# Patient Record
Sex: Female | Born: 1935 | Race: White | Hispanic: No | Smoking: Never smoker
Health system: Southern US, Community
[De-identification: ages and names within clinical notes are randomized; demographics above are authoritative.]

## PROBLEM LIST (undated history)

## (undated) DIAGNOSIS — N059 Unspecified nephritic syndrome with unspecified morphologic changes: Secondary | ICD-10-CM

## (undated) DIAGNOSIS — M545 Low back pain, unspecified: Secondary | ICD-10-CM

## (undated) DIAGNOSIS — G629 Polyneuropathy, unspecified: Secondary | ICD-10-CM

## (undated) DIAGNOSIS — G20 Parkinson's disease: Secondary | ICD-10-CM

## (undated) DIAGNOSIS — R269 Unspecified abnormalities of gait and mobility: Secondary | ICD-10-CM

## (undated) DIAGNOSIS — R413 Other amnesia: Secondary | ICD-10-CM

## (undated) DIAGNOSIS — C541 Malignant neoplasm of endometrium: Secondary | ICD-10-CM

## (undated) DIAGNOSIS — E669 Obesity, unspecified: Secondary | ICD-10-CM

## (undated) DIAGNOSIS — G20A1 Parkinson's disease without dyskinesia, without mention of fluctuations: Secondary | ICD-10-CM

## (undated) DIAGNOSIS — K449 Diaphragmatic hernia without obstruction or gangrene: Secondary | ICD-10-CM

## (undated) DIAGNOSIS — C499 Malignant neoplasm of connective and soft tissue, unspecified: Secondary | ICD-10-CM

## (undated) HISTORY — PX: OTHER SURGICAL HISTORY: SHX169

## (undated) HISTORY — DX: Polyneuropathy, unspecified: G62.9

## (undated) HISTORY — DX: Unspecified abnormalities of gait and mobility: R26.9

## (undated) HISTORY — PX: FOOT SURGERY: SHX648

## (undated) HISTORY — PX: ABDOMINAL HYSTERECTOMY: SHX81

## (undated) HISTORY — DX: Unspecified nephritic syndrome with unspecified morphologic changes: N05.9

## (undated) HISTORY — DX: Low back pain, unspecified: M54.50

## (undated) HISTORY — DX: Diaphragmatic hernia without obstruction or gangrene: K44.9

## (undated) HISTORY — DX: Obesity, unspecified: E66.9

## (undated) HISTORY — DX: Parkinson's disease: G20

## (undated) HISTORY — DX: Low back pain: M54.5

## (undated) HISTORY — DX: Other amnesia: R41.3

## (undated) HISTORY — DX: Malignant neoplasm of endometrium: C54.1

---

## 1968-08-01 DIAGNOSIS — C499 Malignant neoplasm of connective and soft tissue, unspecified: Secondary | ICD-10-CM

## 1968-08-01 HISTORY — DX: Malignant neoplasm of connective and soft tissue, unspecified: C49.9

## 1997-12-15 ENCOUNTER — Encounter: Admission: RE | Admit: 1997-12-15 | Discharge: 1998-03-15 | Payer: Self-pay | Admitting: Family Medicine

## 1998-01-19 ENCOUNTER — Other Ambulatory Visit: Admission: RE | Admit: 1998-01-19 | Discharge: 1998-01-19 | Payer: Self-pay | Admitting: Obstetrics and Gynecology

## 1998-04-02 ENCOUNTER — Ambulatory Visit (HOSPITAL_BASED_OUTPATIENT_CLINIC_OR_DEPARTMENT_OTHER): Admission: RE | Admit: 1998-04-02 | Discharge: 1998-04-02 | Payer: Self-pay | Admitting: Orthopedic Surgery

## 1999-05-10 ENCOUNTER — Encounter: Admission: RE | Admit: 1999-05-10 | Discharge: 1999-05-10 | Payer: Self-pay | Admitting: Family Medicine

## 1999-07-16 ENCOUNTER — Encounter (INDEPENDENT_AMBULATORY_CARE_PROVIDER_SITE_OTHER): Payer: Self-pay | Admitting: *Deleted

## 1999-07-16 ENCOUNTER — Inpatient Hospital Stay (HOSPITAL_COMMUNITY): Admission: EM | Admit: 1999-07-16 | Discharge: 1999-07-21 | Payer: Self-pay | Admitting: *Deleted

## 1999-08-03 ENCOUNTER — Encounter: Admission: RE | Admit: 1999-08-03 | Discharge: 1999-09-28 | Payer: Self-pay | Admitting: Family Medicine

## 1999-09-15 ENCOUNTER — Other Ambulatory Visit: Admission: RE | Admit: 1999-09-15 | Discharge: 1999-09-15 | Payer: Self-pay | Admitting: Obstetrics and Gynecology

## 1999-09-15 ENCOUNTER — Encounter (INDEPENDENT_AMBULATORY_CARE_PROVIDER_SITE_OTHER): Payer: Self-pay

## 1999-09-29 ENCOUNTER — Encounter: Admission: RE | Admit: 1999-09-29 | Discharge: 1999-12-28 | Payer: Self-pay | Admitting: Radiation Oncology

## 1999-10-18 ENCOUNTER — Encounter: Payer: Self-pay | Admitting: Obstetrics and Gynecology

## 1999-10-20 ENCOUNTER — Encounter (INDEPENDENT_AMBULATORY_CARE_PROVIDER_SITE_OTHER): Payer: Self-pay | Admitting: Specialist

## 1999-10-20 ENCOUNTER — Inpatient Hospital Stay (HOSPITAL_COMMUNITY): Admission: RE | Admit: 1999-10-20 | Discharge: 1999-10-24 | Payer: Self-pay | Admitting: Obstetrics and Gynecology

## 1999-11-27 ENCOUNTER — Encounter: Payer: Self-pay | Admitting: Emergency Medicine

## 1999-11-27 ENCOUNTER — Inpatient Hospital Stay (HOSPITAL_COMMUNITY): Admission: EM | Admit: 1999-11-27 | Discharge: 1999-11-30 | Payer: Self-pay | Admitting: Emergency Medicine

## 2000-01-06 ENCOUNTER — Encounter: Payer: Self-pay | Admitting: Surgery

## 2000-01-10 ENCOUNTER — Inpatient Hospital Stay (HOSPITAL_COMMUNITY): Admission: RE | Admit: 2000-01-10 | Discharge: 2000-01-17 | Payer: Self-pay | Admitting: Surgery

## 2000-05-18 ENCOUNTER — Ambulatory Visit (HOSPITAL_BASED_OUTPATIENT_CLINIC_OR_DEPARTMENT_OTHER): Admission: RE | Admit: 2000-05-18 | Discharge: 2000-05-19 | Payer: Self-pay | Admitting: Orthopedic Surgery

## 2001-12-19 ENCOUNTER — Encounter: Admission: RE | Admit: 2001-12-19 | Discharge: 2002-03-19 | Payer: Self-pay | Admitting: Neurology

## 2002-03-18 ENCOUNTER — Inpatient Hospital Stay (HOSPITAL_COMMUNITY): Admission: AD | Admit: 2002-03-18 | Discharge: 2002-03-22 | Payer: Self-pay | Admitting: Orthopedic Surgery

## 2002-03-18 ENCOUNTER — Encounter: Payer: Self-pay | Admitting: Orthopedic Surgery

## 2002-09-03 ENCOUNTER — Encounter: Payer: Self-pay | Admitting: Family Medicine

## 2002-09-03 ENCOUNTER — Encounter: Admission: RE | Admit: 2002-09-03 | Discharge: 2002-09-03 | Payer: Self-pay | Admitting: Family Medicine

## 2002-10-29 ENCOUNTER — Encounter: Admission: RE | Admit: 2002-10-29 | Discharge: 2002-10-29 | Payer: Self-pay | Admitting: Neurology

## 2002-10-29 ENCOUNTER — Encounter: Payer: Self-pay | Admitting: Neurology

## 2004-04-01 ENCOUNTER — Ambulatory Visit (HOSPITAL_BASED_OUTPATIENT_CLINIC_OR_DEPARTMENT_OTHER): Admission: RE | Admit: 2004-04-01 | Discharge: 2004-04-01 | Payer: Self-pay | Admitting: Orthopedic Surgery

## 2006-11-15 ENCOUNTER — Ambulatory Visit (HOSPITAL_BASED_OUTPATIENT_CLINIC_OR_DEPARTMENT_OTHER): Admission: RE | Admit: 2006-11-15 | Discharge: 2006-11-16 | Payer: Self-pay | Admitting: Orthopedic Surgery

## 2007-01-16 ENCOUNTER — Ambulatory Visit (HOSPITAL_BASED_OUTPATIENT_CLINIC_OR_DEPARTMENT_OTHER): Admission: RE | Admit: 2007-01-16 | Discharge: 2007-01-16 | Payer: Self-pay | Admitting: Orthopedic Surgery

## 2007-03-09 ENCOUNTER — Encounter: Admission: RE | Admit: 2007-03-09 | Discharge: 2007-03-09 | Payer: Self-pay | Admitting: Orthopaedic Surgery

## 2007-04-16 ENCOUNTER — Ambulatory Visit: Admission: RE | Admit: 2007-04-16 | Discharge: 2007-04-16 | Payer: Self-pay | Admitting: Orthopaedic Surgery

## 2010-12-14 NOTE — Op Note (Signed)
NAMENIMSI, MALES                 ACCOUNT NO.:  [id number]   MEDICAL RECORD NO.:  [id number]          PATIENT TYPE:  AMB   LOCATION:  DSC                          FACILITY:  [facility]   PHYSICIAN:  Cindee Salt, M.D.       DATE OF BIRTH:  06-10-1929   DATE OF PROCEDURE:  01/16/2007  DATE OF DISCHARGE:                               OPERATIVE REPORT   PREOPERATIVE DIAGNOSIS:  Carpal tunnel syndrome, right hand.   POSTOPERATIVE DIAGNOSIS:  Carpal tunnel syndrome, right hand.   OPERATION:  Decompression of the right median nerve.   SURGEON:  Cindee Salt, M.D.   ASSISTANT:  Carolyne Fiscal R.N.   ANESTHESIA:  Forearm based IV regional.   HISTORY:  The patient is a 77 year old female status post open reduction  and internal fixation of a right distal radius fracture performed on  April 16.  She has developed carpal tunnel syndrome, EMG nerve  conductions are positive.  This has not responded to conservative  treatment.  She is admitted for release of the carpal canal.  She is  aware of risks and complications including infection, recurrence, injury  to arteries, nerves, tendons, incomplete relief of symptoms, dystrophy.  She has elect to proceed. In the preoperative area, questions were  encouraged and answered.  Antibiotic given and the extremity marked by  both the patient and surgeon.   PROCEDURE:  The patient is brought to the operating room where a forearm  based IV regional anesthetic was carried out without difficulty.  She  was prepped using DuraPrep, supine position, right arm free.  A  longitudinal incision was made in the palm and carried down through the  subcutaneous tissues.  Bleeders were electrocauterized, the palmar  fascia was split, superficial palmar arch identified, flexor tendon to  the ring and little finger were identified to the ulnar side of the  median nerve.  The carpal retinaculum was incised with sharp dissection,  right angle and Sewall retractor were placed between  skin and forearm  fascia.  The fascia was released for approximately 1.5 cm proximal to  the wrist crease under direct vision.  The canal was explored.  No  further lesions were identified.  The wound was irrigated.  The skin was  closed with interrupted 5-0 Vicryl Rapide sutures.  A sterile  compressive dressing and splint were applied to the wrist with the  fingers free.  The patient tolerated the procedure well and was taken to  the recovery room for observation in satisfactory condition.  She is  discharged home to return to the [facility] in one week  on Vicodin.           ______________________________  Cindee Salt, M.D.     GK/MEDQ  D:  01/16/2007  T:  01/16/2007  Job:  [id number]

## 2010-12-17 NOTE — Discharge Summary (Signed)
[facility]  Patient:    Carrie [name], Carrie [name]                        MRN: [id number] Adm. Date:  [id number] Disc. Date: [id number] Attending:  Katha Cabal CC:         Desma Maxim, M.D.  Katherine Roan, M.D.  Dyanne Carrel, M.D.  Miguel Aschoff, M.D.  Florencia Reasons, M.D.   Discharge Summary  ADMISSION DIAGNOSIS:  Large hiatal hernia.  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  PROCEDURES:  January 10, 2000, laparoscopic takedown of large hiatal hernia with laparoscopic hiatal hernia repair and fundoplication.  HOSPITAL COURSE:  Ms. Allender came in as an a.m. admission and underwent the aforementioned procedure.  She got along fairly well.  She has Parkinsons disease and was restarted on her Sinemet.  She was mobilized and, by the fifth postoperative day, was improving but was not quite ready to be discharged home to [facility].  At that point, she was switched to oral pain medications, and she was made ready for discharge on January 17, 2000.  FOLLOW-UP:  Follow up was scheduled in the office within two to three weeks. DD:  03/31/00 TD:  04/01/00 Job: [id number]

## 2010-12-17 NOTE — Op Note (Signed)
NAME:  Carrie [name], Carrie [name]                           ACCOUNT NO.:  [id number]   MEDICAL RECORD NO.:  [id number]                   PATIENT TYPE:  OIB   LOCATION:  5021                                 FACILITY:  [facility]   PHYSICIAN:  Katy Fitch. Naaman Plummer., M.D.          DATE OF BIRTH:  04/10/1929   DATE OF PROCEDURE:  03/18/2002  DATE OF DISCHARGE:                                 OPERATIVE REPORT   PREOPERATIVE DIAGNOSES:  1. Open, severely comminuted right distal radius fracture.  2. Displaced ulnar distal metaphyseal/diaphyseal junction shaft fracture,     probably closed.  3. Incipient right carpal tunnel syndrome.   POSTOPERATIVE DIAGNOSES:  1. Open, severely comminuted right distal radius fracture.  2. Displaced ulnar distal metaphyseal/diaphyseal junction shaft fracture,     probably closed.  3. Incipient right carpal tunnel syndrome.   PROCEDURES:  1. Irrigation and debridement of open fracture, right distal radius, with     placement of vessel loop drains.  2. Open reduction and internal fixation of severely comminuted right distal     radius metaphyseal fracture with placement of a volar VVR plate system     and allograft bone graft.  3. Reduction and internal fixation of left distal ulna fracture with a 5/32     intramedullary Steinmann pin.   SURGEON:  Katy Fitch. Sypher, M.D.   ASSISTANT:  Jonni Sanger, P.A.   ANESTHESIA:  General by LMA supervised by the anesthesiologist, Burna Forts, M.D.   INDICATIONS:  The patient is a 77 year old right-hand dominant woman who  tripped over a cat at home earlier this morning in [place], [place]  [place].  The patient's daughter is the Designer, television/film set of the [facility]  [facility].  We have worked with her daughter for many years.   She was brought to the [facility] emergency room, where x-rays were  obtained revealing her comminuted distal radius fracture and displaced  distal ulnar shaft fracture.  The emergency  room doctor contacted [place] at the  hand center and requested a transfer for definitive care of her open  fractures.   Her daughter transported the patient in a splint from the [facility]  emergency room directly to the [facility] short stay admission center.  There she  was evaluated by myself and Jonni Sanger, P.A.   She was noted to have a properly splinted distal double-bone forearm  fracture with moderate swelling of her fingers noted.  She had considerable  ecchymosis of her volar forearm.  Sensory testing revealed diminished  sensibility in the median distribution, normal sensibility in the ulnar and  radial distribution.   Her x-rays were reviewed, revealing a severely comminuted fracture  predicament.   Her past medical history is remarkable for an episode of severe anemia and  high-output heart failure worked up previously in the past at [facility].  Her primary care physician is  Desma Maxim, M.D.   She was evaluated by Burna Forts, M.D., of the anesthesia service, and  arrangements were made for general anesthesia by LMA.   After informed consent, she is brought to the operating room at this time  anticipating irrigation and debridement of her fractures, IV antibiotic  therapy, and ORIF.   DESCRIPTION OF PROCEDURE:  The patient is brought to the operating room and  was placed in supine position on the operating table.  Following the  induction of general anesthesia, the left arm was carefully removed from the  splint and gently the fractures were supported during prep.  The left arm  was prepped with Betadine soap and solution and sterilely draped.  A  pneumatic tourniquet was applied to the proximal brachium.  Ancef 1 g had  been administered in the emergency room at [facility],  approximately five hours prior to surgery.  A second gram of Ancef was  provided intravenously in the OR as a prophylactic antibiotic.   After exsanguination  of the limb with an Esmarch bandage, an arterial  tourniquet at the proximal brachium was inflated to 260 mmHg.  The procedure  commenced with opening of the pinhole-type inside-out open fracture wounds.  These were subsequently probed with a blunt hemostat and irrigated  thoroughly with triple antibiotic solution.   The carpal tunnel and radius fracture were then exposed through an extended  VVR incision.  This is an incision that parallels the thenar crease, crosses  the wrist flexion crease apex radial, and then follows the flexor carpi  radialis 10 cm above the wrist.  The subcutaneous tissues were carefully  divided, taking care to identify and spare the radial sensory branches.  The  radial artery was noted and the fascia overlying the flexor carpi radialis  incised.  The flexor carpi radialis was retracted in the radial direction  and its fascia at the floor of the tendon sheath incised with a scalpel.  Fracture hematoma was encountered and removed.  The fascia was released,  exposing the flexor pollicis longus, which was gently retracted in an ulnar  direction, followed by identification of the fracture site by palpation.  The pronator quadratus was released from the radial insertion deep to the  radial artery along the periosteal ridge adjacent to the radial styloid and  the brachioradialis insertion.  The pronator quadratus was elevated as an  ulnar-based flap.  The severely comminuted fracture was noted.  There was a  large free fragment of the ulnar aspect of the distal radial shaft that was  displaced up into the pronator quadratus muscle.   The fractures were virtually anatomically reduced with three-point molding.  A short VVR plate was placed with provisional fixation.  AP and lateral C-  arm images documented satisfactory reduction.   The VVR plate system was then placed with four threaded screws, each properly deployed to support the distal metaphyseal and epiphyseal  fracture  fragment.  There was extraordinary comminution of the distal metaphysis.  This was addressed by replacement of free fragments of bone recovered from  the pronator quadratus and surrounding soft tissues, followed by use of an  allograft from the bone bank.   Croutons were placed, filling the cavity in the distal radius.   The large fragment adjacent to the distal radioulnar joint was replaced  anatomically.  This was rather challenging to hold in position, however, due  to the pull of the pronator quadratus.   The  pronator quadratus was then repaired over the VVR plate system after  placement of the radial screws.   AP, lateral C-arm images documented a virtually anatomic reduction of the  radius.   The patient was noted to be slightly ulnar-positive.  Radial slope and tilt  were recovered.  The comminuted dorsal fragments were pulled back into  satisfactory position.   The pronator quadratus was then secured with three mattress sutures of 3-0  Vicryl to near-anatomic position, followed by closure of the skin with  subdermal sutures of 3-0 Vicryl and intradermal 3-0 Prolene and Steri-  Strips.  Vessel-loop drains were placed through the fracture site and out  the puncture wounds created by the open fractures.  These were looped to  prevent loss of the vessel-loop drains into the wound.   The carpal tunnel was then approached through a palmar incision.  The palmar  fascia was split with scissors, followed by identification of the distal  margin of the transverse carpal ligament.  Once the common sensory branches  of the median nerve were identified and retracted, the transverse carpal  ligament was released subcutaneously with scissors, extending into the  distal forearm.  There was hematoma extending from the fracture site into  the carpal canal and midpalmar space.  The median nerve appeared to be  compressed but otherwise intact.   Bleeding points were  electrocauterized with bipolar current.   This wound was then repaired with intradermal 3-0 Prolene.   Attention was then directed to the ulna.  A small incision was fashioned  between the extensor carpi ulnaris and the flexor carpi ulnaris.  The distal  epiphysis of the ulna was identified, and a small bone awl was used to  create a very small 2 mm hole.  A 5/32 Steinmann pin was bent in the shape  of an intramedullary nail and carefully placed into this on an oblique  angle, approaching the bone at approximately 20 degrees.  With the aid of a  C-arm fluoroscope, this was threaded across the fracture site, and the nail  had been pre-bent to correct the bowing of the ulna.  The ulna snapped back  to virtually anatomic position after placement of the nail.   This type of fixation will allow the ulna to heal in a slightly shortened  and impacted manner, which hopefully will correct the ulnar-plus  predicament.  This wound was then repaired with intradermal 3-0 Prolene.   AP, lateral C-arm images were obtained documenting virtually anatomic  reconstruction of both-bone forearm fracture.   The hand was placed in a voluminous gauze dressing and a sugar tong splint  applied.  The patient was awakened from anesthesia and transferred to  recovery with stable vital signs.  She will be admitted to the hospital for  observation of her vital signs, IV antibiotic therapy in the form of Ancef 1  g IV q.8h., and appropriate analgesics.   We anticipate dressing change and drain removal at 48 hours.                                               Katy Fitch Naaman Plummer., M.D.    RVS/MEDQ  D:  03/18/2002  T:  03/19/2002  Job:  [id number]   cc:   Desma Maxim, M.D.   Anesthesia Department

## 2010-12-17 NOTE — Consult Note (Signed)
[facility]  Patient:    Carrie [name]                           MRN: [id number] Proc. Date: 07/17/99 Adm. Date:  [id number] Attending:  Miguel Aschoff CC:         Desma Maxim, M.D.                          Consultation Report  HISTORY OF PRESENT ILLNESS:  Dr. Anastasio Auerbach of the [facility], asked e to see this very pleasant 77 year old female because of severe iron-deficiency anemia.  The patient was admitted to the hospital yesterday, with high output cardiac failure, characterized by dyspnea and progressive edema, and was found to have  hemoglobin of 5.6, MCV 63, and iron saturation of only 3%  She is currently getting transfused.  Her stool was Hemoccult negative today.  She has essentially no: I tract symptoms such as anorexia, abdominal pain, significant reflux symptoms, constipation, diarrhea, melena or hematochezia.  But her history is significant for a family history of colon cancer in her father.  Apparently, the patient had a complete colonoscopy about 15 years ago, plus another colon exam (flex sig) versus colonoscopy in [facility]) about three years ago.  Of note:  The patient does use several aspirin daily; she also uses occasional NSAIDs, but has not had any obvious dyspeptic symptoms arising from use of those medications.  PAST MEDICAL HISTORY:  Operations - No abdominal surgery.  She has not had a hysterectomy and there has been some vaginal spotting.  Chronic medical illnesses - History of Parkinsons and above-mentioned recent CHF, but generally in good health, without any history of previous MI (does have a history of "angina" or COPD) although has had exertional dyspnea recently.  No history of diabetes. No hypertension.  ALLERGIES:  SULFA DRUGS.  MEDICATIONS:  Outpatient - Lasix, Sinemet, Minipress, Zoloft, K-Dur and Prometrium.  HABITS:  Nonsmoker/nondrinker.  FAMILY HISTORY:  Colon cancer in  her father, per HPI.  SOCIAL HISTORY:  The patient is widowed and lives in [place].  REVIEW OF SYSTEMS:  Per HPI.  PHYSICAL EXAMINATION:  GENERAL:  A substantially overweight pleasant white female.  HEENT:  She is anicteric, without any obvious Parkinsonian tremor or masked facies.  CHEST:  Exam shows a few rales at the right base.  CARDIAC:  Exam shows a questionable S4 with a regular rhythm.  ABDOMEN:  Quite obese, with active bowel sounds but without palpable organomegaly, guarding, mass or tenderness.  RECTAL:  Exam not performed.  PERTINENT LABORATORY:  Per HPI - CMET pertinent for albumin of 2.9 and normal liver chemistries and glucose.  IMPRESSION: 1. Severe iron-deficiency anemia of unclear cause in a patient on daily ulcergenic medications and with a family history of colon cancer but without worrisome symptoms. 2. High output cardiac failure with edema, secondary to #1.  PLAN:  I would favor waiting until the patient is fairly well transfused and has had improvement in her pulmonary status, after which I feel that both endoscopic and colonoscopic evaluation would be warranted.  The nature, purpose and risks f these procedures were reviewed with the patient and she is agreeable.  We will continue to follow her progress in the hospital, to help assist with timing of he endoscopy and colonoscopy, presumably a couple of days from now.  As noted, the  patient does appear to be agreeable to the test. DD:  07/18/99 TD:  07/18/99 Job: [id number]

## 2010-12-17 NOTE — Op Note (Signed)
[place]  Patient:    Carrie [name], Carrie [name]                        MRN: [id number] Proc. Date: 05/24/00 Adm. Date:  [id number] Disc. Date: [id number] Attending:  Colbert Ewing                           Operative Report  PREOPERATIVE DIAGNOSIS:  Claw toe and mallet toe, second toe, left foot, symptomatic.  POSTOPERATIVE DIAGNOSIS:  Claw toe and mallet toe, second toe, left foot, symptomatic.  OPERATION PERFORMED:  Correction of mallet and claw toe, left foot with flexor tendon release to correct mallet toe and partial resection, proximal phalanx with proximal interphalangeal fusion to correct claw toe.  SURGEON:  Loreta Ave, M.D.  ASSISTANT:  Arlys John D. Petrarca, P.A.-C.  ANESTHESIA:  General.  SPECIMENS:  None.  CULTURES:  None.  COMPLICATIONS:  None.  TOURNIQUET:  30 minutes with a calf tourniquet.  DESCRIPTION OF PROCEDURE:  The patient was brought to the operating room and after adequate anesthesia had been obtained, the left leg was prepped and draped in the usual sterile fashion with a calf tourniquet in placed. Exsanguinated with elevation and Esmarch.  Tourniquet inflated to 200 mmHg. Marked fixed clawing of the PIP and DIP joint left second toe.  The flexor tendon was released to correct the mallet toe through a small incision on the lateral side of the toe avoiding neurovascular structures.  With a subcutaneous release of the flexor tendon, the mallet toe could be corrected to straight position.  I then made an elliptical incision over the dorsal aspect of the PIP joint.  Extensor tendon partially resected for correction. The distal end of the proximal phalanx exposed and resected back so I could correct the toe to a neutral position denuding articular cartilage on the base of the middle phalanx as well.  Once this could be aligned nicely, a  K-wire was driven from the end of the toe across the DIP and PIP joint in a  corrected position for both.  I then used nylon to do a full thickness closure of the extensor tendon of the joint capsule and the skin over the PIP joint and sutured this with a bolster of Xeroform atop this.  The other wound was closed with nylon as well.  Digital block was placed with Marcaine without epinephrine for postoperative analgesia.  Sterile compressive dressing applied.  Tourniquet deflated and removed.  Wooden shoe applied.  Anesthesia reversed.  Brought to recovery room.  Tolerated surgery well.  No complications. DD:  05/24/00 TD:  05/24/00 Job: [id number]

## 2010-12-17 NOTE — H&P (Signed)
[facility] [name]. [facility]  Patient:    Carrie [name], Carrie [name]                  MRN: [id number] Adm. Date:  [id number] Attending:  Edmund Hilda CC:         Carrie [name], M.D.             Carrie [name], M.D.             Carrie [name], M.D.             Carrie [name], M.D.                         History and Physical  ADMITTING DIAGNOSES:  1. Right-sided pyelonephritis.  2. Parkinsons disease.  3. Status post recent hysterectomy for endometrial cancer (five weeks ago).  HISTORY OF PRESENT ILLNESS: The patient is a 77 year old white female with a two day history of fever, right flank pain, and abdominal pain, and dysuria, who presents to the emergency room for evaluation.  She figured she probably had a kidney infection since this is about how she felt when she had her last one five years ago.  Otherwise, she has been doing pretty well and in her usual state of health.  PAST MEDICAL HISTORY:  1. Parkinsons disease.  2. Hysterectomy five weeks ago for endometrial cancer.  3. Persistent anemia, probably secondary to irritated hiatal hernia per her     daughters history.  4. Hiatal hernia.  The patient was planning to have a laparoscopic Nissen     fundoplication until recently diagnosed with her endometrial cancer and     therefore she has postponed the laparoscopic Nissen.  5. 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  MEDICATIONS:  1. Mirapex 1 mg t.i.d.  2. Sinemet CR 25/100 1/2 t.i.d.  3. Zoloft 100 mg q.d. for depression.  ALLERGIES: SULFA.  SOCIAL HISTORY: The patient lives in [place], [place].  Her family is very involved.  She does not smoke or drink alcohol.  FAMILY HISTORY: Her mother died of old age.  Her father died of some type of GI cancer.  She has six brothers and sisters who have all died of various types of cancer including brain and pancreas.  REVIEW OF SYSTEMS: Positive for urinary  frequency, urgency, dysuria, fever, right lower quadrant abdominal pain radiating around to her right flank, and acid reflux symptoms.  Review Of Systems is negative for cough, dyspnea, chest pain, diarrhea, rash, or headache.  PHYSICAL EXAMINATION:  GENERAL: The patient is alert and oriented and in no acute distress, slightly uncomfortable.  VITAL SIGNS: Rectal temperature 103.2 degrees, blood pressure 127/70, pulse 118, respiratory rate 18.  HEENT: Slightly dry mucous membranes.  NECK: No JVD, bruits, or mass.  HEART: Regular rate and rhythm without murmur.  LUNGS: Clear.  ABDOMEN: Soft and tender in her right lower quadrant and right CVA.  LABORATORY DATA: Hemoglobin 11.2, WBC 8.4 with 83% neutrophils; platelet count 177,000.  Electrolytes show a sodium of 138, potassium 3.8, chloride 105, BUN 19, bicarbonate 27, creatinine 0.7, glucose 123, total bilirubin 1.1, SGOT 17. Urinalysis shows a specific gravity of 1.009, with TNTC wbc/hpf; rare epithelial cells; large amount leukocyte esterase; positive nitrite.  IMPRESSION: The patient is a 77 year old white female with probable right pyelonephritis.  She is currently fairly clinically stable even though she has such a high fever  and terrible looking urine.  Await urine and blood cultures. Will place her on IV Cipro.  Will continue with IV fluids and observe her condition. DD:  11/28/99 TD:  11/29/99 Job: [id number]

## 2010-12-17 NOTE — Op Note (Signed)
[name], Carrie [name]                 ACCOUNT NO.:  [id number]   MEDICAL RECORD NO.:  [id number]          PATIENT TYPE:  AMB   LOCATION:  DSC                          FACILITY:  [facility]   PHYSICIAN:  Cindee Salt, M.D.       DATE OF BIRTH:  [birth year]   DATE OF PROCEDURE:  DATE OF DISCHARGE:                               OPERATIVE REPORT   PREOPERATIVE DIAGNOSES:  Fractured distal radius right wrist.   POSTOPERATIVE DIAGNOSES:  Fractured distal radius right wrist.   PROCEDURE:  Open reduction internal fixation on distal radius right  wrist with DVR plate, bone graft.   SURGEON:  Cindee Salt, M.D.   ANESTHESIA:  Ancillary block.   ANESTHESIOLOGIST:  Quita Skye. Krista Blue, M.D.   INDICATIONS FOR PROCEDURE:  The patient is a 77 year old female who  suffered a fall with a displaced fracture of the distal radius with  intraarticular comminution.  She is admitted now for open reduction  internal fixation.  She has undergone a similar procedure per left side  in the past.  She is aware of risks and complications including  infection, recurrent injury to artery, nerve, tendons, incomplete relief  of symptoms, dystrophy.  She has elected to proceed to have this done.  Preoperatively, the extremity is marked by both patient and surgeon.  Antibiotic given.   PROCEDURE IN DETAIL:  The patient was brought to the operating room  where she was placed in supine position, prepped and draped using  DuraPrep.  A limb was exsanguinated with an Esmarch bandage.  Tourniquet  placed high in the arm as inflated to 250 mmHg.  The volar incision was  made longitudinally over the flexor carpi radialis, carried radially and  then onto the thenar eminence, carried down through subcutaneous tissue.  Bleeders were electrocauterized, the dissection carried between the  flexor carpi radialis, radial artery.  These were both identified and  protected, dissection carried down to the pronator quadratus.  The  pronator was  noted to be interspersed in the fracture fragment not  allowing attempted reduction during image intensification  prior to the  procedure.  This was removed.  The brachial radialis was then dissected  from the radial styloid. This allowed reduction of the fracture  fragments in both AP and lateral directions confirmed by [facility] image  intensification.  A standard volar right DVR plate was then placed,  pinned into position with K-wires.  X-rays confirmed its positioning.  The sliding screw was then placed which measured 14 mm.  This firmed  affixed the plate and position. The distal pins were then placed. These  measured between 24 and 18 mm.  Pegs were placed in the ulnar aspect  subtracting 2 mm from each PEG from the dorsal cortex.  Screws were  placed in the radial, yellow were locking.  These firmly fixed fracture  fragment in position. The remaining 4 screws in the proximal aspect of  the plate were then inserted. Each measured 14 mm.  This firmly affixed  the plate in place.  X-rays confirmed the position of the plate an  screws with none penetrating into the articular surface and obliques.  A  bone graft was then placed into the dorsal defect, through the dorsal  aspect.  X-rays confirmed that filling the posthion of the fracture  fragment, the wound was then copiously irrigated with saline. The  pronator quadratus were paired with figure of 8, 2-0 Vicryl sutures.  The thin subcutaneous tissue closed with 2-0 Vicryl and the skin closed  with 4-0 Vicryl rapid sutures.  His tourniquet was deflated and on  deflation of the tourniquet, all fingers immediately pinked.  She was  taken to the recovery room for observation in satisfactory condition.   DISPOSITION:  She will be discharged to home to return to [facility] in 1 week on Percocet.  She is admitted for  overnight stay for pain control.           ______________________________  Cindee Salt, M.D.      GK/MEDQ  D:  11/15/2006  T:  11/15/2006  Job:  [id number]   cc:   Cindee Salt, M.D.

## 2010-12-17 NOTE — Discharge Summary (Signed)
[facility]  Patient:    BRI, Carrie [name]                        MRN: [id number] Adm. Date:  [id number] Disc. Date: [id number] Attending:  Katha Cabal CC:         Dyanne Carrel, M.D.  Desma Maxim, M.D.  [place] Daphine Deutscher, M.D.  Katherine Roan, M.D.   Discharge Summary  CONSULTATIONS: None.  PROCEDURES: None.  DISCHARGE DIAGNOSES:  1. Escherichia coli pyelonephritis.  2. Urge incontinence.  3. Recent hysterectomy secondary to endometrial cancer, five weeks     postoperative.  4. Massive hiatal hernia, with surgery pending convalescence from recent     hysterectomy.  5. History of congestive heart failure, diastolic (2D echocardiogram     December 2000).  6. History of severe iron deficiency anemia secondary to hiatal hernia.  7. B-12 deficiency by serum testing December 2000.  8. History of Parkinsons disease diagnosed in 1995, followed by Dr. Sandria Manly.  9. History of obesity. 10. History of allergy to SULFA (itching, hives).  DISCHARGE MEDICATIONS:  1. Lasix 80 mg q.d.  2. KCl 20 mEq b.i.d.  3. Zoloft 100 mg q.d.  4. Sinemet CR 25/100 1/2 tablet t.i.d.  5. Mirapex 1 mg t.i.d.  6. Cipro 250 mg 1 p.o. b.i.d. to complete course.  7. Detrol 2 mg p.o. b.i.d.  8. Protonix 1 p.o. q.d.  9. Iron tablets to resume previous dosing regimen.  DISCHARGE FOLLOW-UP: The patient is scheduled to follow up with Dr. Manus Gunning on Monday, Dec 06, 1999, at 9 a.m.  Home health PT will be arranged to help with ambulation.  HISTORY OF PRESENT ILLNESS: The patient is a 77 year old woman who presented to the emergency room with a two day history of fever, right flank and abdominal pain, and dysuria.  PHYSICAL EXAMINATION:  VITAL SIGNS: On examination temperature was 103.2 degrees rectally, blood pressure 127/70, pulse 118, and respirations 18.  ABDOMEN: Soft, tender right lower quadrant, presence of right costovertebral angle  tenderness.  LABORATORY DATA: Blood work included a hemoglobin of 11.2, WBC 8.4.  Sodium was 138, potassium 3.8, BUN 19, creatinine 0.7.  Total bilirubin was 1.1, SGOT 17.  Urinalysis showed wbcs TNTC, nitrite positive, large amount leukocyte esterase.  HOSPITAL COURSE: Ms. Vink was admitted to the hospital with a diagnosis of pyelonephritis to undergo therapy including IV hydration and IV antibiotics. #1 - PYELONEPHRITIS: Ms. Gauer improved fairly quickly with routine therapy. Abdominal pain and back pain resolved, appetite improved, and strength improved.  Blood cultures remained negative, and urine culture was positive for E. coli.  On the second hospital day she was changed to oral antibiotic, on which she remained afebrile, discharged in stable and improved condition, asymptomatic except for mild residual weakness.  #2 Cloyd Stagers INCONTINENCE: Ms. Harnish relayed difficulty maintaining her regimen of Lasix due to problems with incontinence.  She describes her symptoms as primarily urge difficulty and maintaining bladder control.  At the time of discharge she will begin an empiric trial of Detrol.  A one week supply will be ordered, with full prescription to be written by her primary care doctor should she receive benefit.  I believe that control of this problem will be essential to her compliance with her diuretic.  #3 - HISTORY OF CONGESTIVE HEART FAILURE: Ms. Bosler was admitted in December 2000 with florid anasarca, at which time left ventricular function was noted to be  hyperdynamic and she was severely anemic.  She has responded well to diuretics in the interim, but is reaccumulating fluid at the time of discharge (asymptomatic) as her Lasix was not resumed until late in her hospitalization this time.  Her doses of potassium and Lasix may need adjusting based on clinical symptoms and laboratory monitoring.  #4 - AMBULATION DIFFICULTIES: Ms. Mahmood was assessed by the mobility  team, who noted the patient required moderate assistance with bed mobility, minimum to moderate assistance with sitting and standing, rolling walker with minimal assistance to ambulate 70 feet.  Ms. Dantes has Parkinsons disease with a classic festinating gait.  She will likely benefit from gait training for a short period in the home.  She is referred to home physical therapy. DD:  11/30/99 TD:  12/01/99 Job: [id number]

## 2010-12-17 NOTE — Procedures (Signed)
[place]  Patient:    Carrie [name]                           MRN: [id number] Proc. Date: 07/19/99 Adm. Date:  [id number] Attending:  Miguel Aschoff CC:         Desma Maxim, M.D.             Genene Churn. Love, M.D.                           Procedure Report  PROCEDURE:  Upper endoscopy with biopsies.  SURGEON:  Florencia Reasons, M.D.  INDICATIONS:  This is a 77 year old female, who presented to the hospital with igh output cardiac failure and was found to have profound iron-deficiency anemia with a hemoglobin of 5.8 and MCV of 63 and an iron saturation of 3%, but was hemoccult  negative at the time of admission.   Of note, she is on daily ulcerogenic medications.  FINDINGS:  Huge hiatal hernia with approximately 50% of the stomach up in the chest.  Also, antral nodule not felt to be clinically significant.  No aspirin-induced gastropathy evident.  PROCEDURE:  The nature, purpose, risks of the procedure had been discussed with  the patient, who provided written consent.  Sedation was fentanyl 50 mcg and Versed 5.5 mg IV without arrhythmias or desaturation.  The Olympus video endoscope was passed easily under direct vision.  The vocal cords looked normal.  The esophagus was easily entered and was entirely normal.  The squamocolumnar junction was at 33 cm and below this was a 7 cm hiatal hernia with the diaphragmatic hiatus at about 40 cm.  The hiatal hernia was very capacious nd it is felt that approximately 50% of the patients stomach was up in the chest.   The abdominal portion of the stomach was entered.  The antrum of the stomach had a nodule measuring about 1.5 or 2 cm, basically benign in appearance, but with some erythematous character, conceivably neoplastic.  Therefore, several biopsies were obtained.  There was also a tiny bump or polyp on the posterior wall of the gastric mucosa near this area not felt to be  clinically significant, but again, I attempted to obtain a shave biopsy of it as well.  It basically got scraped off, so it is no clear if tissue from it was obtained for analysis or not.  There was no evidence of ulcers, gastritis, erosions, or Cameron lesions (ulcerations at the diaphragmatic hiatus) as seen on retroflexed viewing.  The pylorus, duodenal bulb and second duodenum were unremarkable.  Biopsies were obtained from the second duodenum to rule out celiac disease, which can occasionally present as iron-deficiency anemia, and from the antral nodule as noted above.  The scope was then removed from the patient, who tolerated the procedure well and without apparent complication.  IMPRESSION: 1. Huge hiatal hernia, which probably accounts for the patients iron-deficiency  anemia. 2. Antral nodule, probably not clinically significant.  PLAN:  Await pathology on the biopsies and proceed to colonoscopic evaluation. DD:  07/19/99 TD:  07/21/99 Job: [id number]

## 2010-12-17 NOTE — Procedures (Signed)
[place]  Patient:    Carrie [name]                           MRN: [id number] Proc. Date: 07/19/99 Adm. Date:  [id number] Attending:  Miguel Aschoff CC:         Desma Maxim, M.D.             Genene Churn. Love, M.D.                           Procedure Report  PROCEDURE:  Colonoscopy.  SURGEON:  Florencia Reasons, M.D.  INDICATIONS:  A 77 year old female, who presented to the hospital with profound  iron-deficiency anemia (hemoglobin 5.8, iron saturation 3%).  There is a family  history of some sort of gastrointestinal malignancy in her father, possibly colon cancer.  Her upper endoscopy, just performed, shows a huge hiatal hernia, which  might account for her anemia.  FINDINGS:  Normal colonoscopy to the cecum.  PROCEDURE:  The nature, purpose, and risks of the procedure had been reviewed with the patient, who provided written consent.  Sedation was fentanyl 50 mcg and Versed 5.5 mg IV prior to and during this procedure in the upper endoscopy, which preceded it.  The Olympus PCF 160AL adjustable tension pediatric video colonoscope was advanced around the colon with some looping, which caused patient discomfort, but once loops were taken out, the scope advanced quite easily and we were able to reach the base of the cecum.  The terminal ileum was not entered.  Pullback was then performed.  The quality of the prep was excellent and it is felt that all areas were well seen.  This was basically a normal examination.  No polyps, cancer, colitis, vascular malformations or diverticular disease were observed and retroflexion of the rectum was unremarkable.  No biopsies were obtained.  The patient tolerated the procedure well and there were no apparent complications.  IMPRESSION:  Normal colonoscopy in a patient with a history of iron-deficiency anemia (most likely due to her large hiatal hernia) and a family history  of colon cancer.  RECOMMENDATIONS: 1. Replete iron. 2. Follow hemoglobin. 3. Consider hiatal hernia repair. 4. If a hiatal hernia repair is not undertaken in this patient, she should probably    have frequent hemoglobin determinations and iron studies to make sure that she    is not falling back into iron-deficiency anemia. 5. Consider follow-up colonoscopy in about five years because of the family history    of possible colon cancer. DD:  07/19/99 TD:  07/21/99 Job: [id number]

## 2010-12-17 NOTE — Discharge Summary (Signed)
[place]. [facility]  Patient:    KENYA KOOK                           MRN: [id number] Adm. Date:  [id number] Disc. Date: 07/21/99 Attending:  Miguel Aschoff CC:         Desma Maxim, M.D., primary care physician             Florencia Reasons, M.D.             [place] Daphine Deutscher, M.D.                           Discharge Summary  DATE OF BIRTH:  [birth year]  CONSULTS: 1. Florencia Reasons, M.D. 2. [place]. Daphine Deutscher, M.D.  PROCEDURES:  Upper endoscopy and colonoscopy.  DISCHARGE DIAGNOSES: 1. Massive hiatal hernia, 7 cm (50% of stomach and chest). 2. Severe iron deficiency anemia, secondary to above. 3. Congestive heart failure, secondary to anemia. 4. B12 deficiency by serum testing, this admission. 5. History of bilateral lower extremity lymphedema, diagnosed 1998 by Dr. Liliane Bade. 6. History of left fibula stress fracture, six to eight weeks ago. 7. History of Parkinsons, diagnosed 1995, with follow-up by Dr. Melbourne Abts. 8. History of obesity.  DISCHARGE MEDICATIONS: 1. Lasix 40 mg p.o. q.d. 2. Potassium 20 mEq p.o. q.d. 3. ______ 1 mg p.o. t.i.d. 4. Sinemet 25/100 one-half tablet p.o. t.i.d. 5. Zoloft 100 mg p.o. q.d. 6. Prometrium 100 mg p.o. b.i.d. on days 14-25. 7. Premarin 0.625 mg p.o. q.d. 8. Ferrous sulfate 325 mg 1 p.o. b.i.d. 9. Protonix 40 mg q.d.  DISCHARGE INSTRUCTIONS: 1. Patient is advised to walk with a walker as she is doing, and to resume wearing    of thigh-high compression stockings. 2. She is to limit her salt intake. 3. She is to weigh herself daily and inform Dr. Arvilla Market should her weight rise by    two to three pounds.  DISCHARGE FOLLOW-UP:  Scheduled with Dr. Arvilla Market, December 29, 10 a.m.  Dr. Wenda Low will arrange office follow-up with her prior to scheduling of hiatal hernia repair surgery.  SUMMARY:  Mrs. Burnett is a 77 year old woman who lives independently with close  attention from  family, who had developed progression of edema over the two to three weeks prior to admission, from baseline chronic levels below the knee to involving thighs, abdominal girth, arms, and face, accompanied by shortness of breath, wheezing, and intermittent anterior sharp chest pain.  She has no history of coronary disease or thromboembolic disorder and described no recent medicine or  dietary indiscretions.  Further history reveals recent dysphagia to solids and liquids, but no odynophagia.  She does experience occasional dyspepsia attributed to her hiatal hernia.  Vaginal bleeding has occurred since Prometrium was begun, as prescribed by Dr. Silas Sacramento, her gynecologist.  She described no change in bowel or  bladder habits, and no dysuria.  On admission, Mrs. Gell was noted to be quite uncomfortable in the sitting position, with increased work of breathing and shortness of breath.  She was grossly edematous from tight bilateral edema of the feet to thighs, as well as generalized edema in an anasarca-type distribution involving the face, arms, and abdomen.  Blood pressure and pulse were stable, and oxygenation was, surprisingly, 96% on room air.  Her lungs revealed expiratory wheezes and basilar crackles occluding heart  sounds, although distal pulses were strong and symmetric.  Chest x-ray suggested congestive heart failure.  Mrs. Craine was admitted for further evaluation and treatment of anasarca and congestive heart failure.  HOSPITAL COURSE: #1 - IRON DEFICIENCY AND B12 DEFICIENCY ANEMIA:  Admission labs notable for hemoglobin of 5.8 and MCV of 63.  Mrs. Presti was transfused three units, which  restored her hemoglobin to 9.9 by the time of hospital discharge.  Remainder of  serum workup revealed total iron binding capacity 413, iron 13 with percent saturation 3, and B12 also noted low at 200.  She received B12 IM this admission, and will require monthly B12 injections  indefinitely, as well as iron supplementation until her iron saturation normalizes, regardless of her hemoglobin. GI evaluation was pursued, as described below.  #2 - LARGE HIATAL HERNIA:  Mrs. Arauz underwent upper and lower endoscopy, the  former revealing a 7 cm hiatal hernia which was her likely cause of iron deficiency anemia, complicated by the fact that her hiatal aperture is also quite large, further explaining some of her orthopnea symptoms.  Colonoscopy was negative to the cecum, although study should be repeated in five years, given a positive family  history of colon cancer.  Dr. Wenda Low briefly consulted in the hospital to help arrange outpatient follow up and plans for surgical correction.  Mrs. Wickers symptoms are stable.  She should continue Protonix and iron supplementation, but may leave the hospital prior to returning on an elective basis.  #3 - CONGESTIVE HEART FAILURE:  Mrs. Delfino was aggressively diuresed, tolerated well despite weight decrease from 240 pounds on the day of admission in the office to 209 pounds on day of discharge.  She is initially begun on ACE inhibitor, but 2-D echocardiogram showed hyperdynamic left ventricular function and normal wall thickness, excluding indications for this medication, which was subsequently discontinued.  Her overall appearance has improved dramatically, with complete resolution of edema above the knees.  I am somewhat suspicious of diagnosis of lymphedema as well, as lots of her below-the-knee edema has resolved as well. er skin and subcutaneous tissues are much more supple in these areas, and I feel she will benefit from further diuresis.  She will continue to wear her thigh-high stockings, and has requested a re-referral to occupational therapy (?) where she says she has undergone lymphedema pumping with good results in the past.  Mrs. Parkison has had difficulty with her Lasix and the inconvenience  polyuria causes.  She has actually not been taking 80 mg a day as prescribed.  I will try to  reduce her daily dose to something possibly more tolerable in the hopes that she may be more compliant.  This will need close monitoring.  I do feel that she could successfully diurese further.  Note, TSH normal at 5.  She should need no special treatment for CHF, as this was entirely high output.  DISCHARGE CONDITION:  Weight 208.5, blood pressure 120/70 with pulse 80, saturation 93% on room air.  Patient is walking with a rolling walker in the hall without difficulty, orthostasis, or weakness.  Discharge hemoglobin 9.9.  Renal function normal with BUN 14, creatinine 0.7.  Glucose normal this admission. DD:  07/21/99 TD:  07/23/99 Job: [id number]

## 2010-12-17 NOTE — Consult Note (Signed)
[place]  Patient:    Carrie [name]                           MRN: [id number] Proc. Date: 07/21/99 Adm. Date:  [id number] Attending:  Miguel Aschoff CC:         Miguel Aschoff, M.D.             Florencia Reasons, M.D.             Desma Maxim, M.D.             Katherine Roan, M.D.             Lubertha Basque Jerl Santos, M.D.                          Consultation Report  CHIEF COMPLAINT:  Giant hiatal hernia and anemia.  HISTORY:  [name] is a 77 year old mother of Vania Rea, the head nurse at  [employer] who I was asked to see regarding potential surgical management for her huge hiatal hernia.  She has been admitted at this time with anemia and congestive failure which is felt to be secondary to this hiatal hernia with chronic GI blood loss.  On endoscopy, she was found to have a 7 cm hiatal hernia with about 50% of her stomach in her chest.  This was at the time of Dr. Haig Prophet endoscopy.  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  IMPRESSION:  The purpose of this visit was to meet the patient and talk with her briefly about surgical strategies for repair of hernia.  At this time, I think e need to let her go home from the hospital for the holidays, and I can see her and discuss this with her in more detail in the office and provide literature for her.  I would like to know more about the etiology of her lymphedema in light of her previous sarcoma.  I will discuss with Dr. Matthias Hughs and will make final recommendations after office consultation.  PLAN:  Discharge per Dr. Mayford Knife to follow up to see me in the office after the New Year. DD:  07/21/99 TD:  07/21/99 Job: [id number]

## 2010-12-17 NOTE — Op Note (Signed)
[facility]  Patient:    Carrie [name], Carrie [name]                        MRN: [id number] Adm. Date:  [id number] Attending:  Katha Cabal CC:         Desma Maxim, M.D.             Katherine Roan, M.D.             Dyanne Carrel, M.D.             Miguel Aschoff, M.D.             Florencia Reasons, M.D.                           Operative Report  PREOPERATIVE DIAGNOSIS:  Huge hiatal hernia with organoaxial volvulus of the stomach.  POSTOPERATIVE DIAGNOSIS:  Huge hiatal hernia with organoaxial volvulus of the stomach.  PROCEDURE:  Laparoscopic takedown of large hiatal hernia, repair of esophageal hiatus, with Nissen fundoplication.  SURGEON:  Thornton Park. Daphine Deutscher, M.D.  ASSISTANT:  Currie Paris, M.D.  ANESTHESIA:  General endotracheal.  FINDINGS:  Large hiatal hernia, aberrant hepatic branch, preserved.  DESCRIPTION OF PROCEDURE:  Adore Kithcart was taken to OR #1 on the afternoon of January 10, 2000, and given general anesthesia.  The abdomen was prepped with Betadine and draped sterilely.  A supraumbilical approach was made, and a little longitudinal incision was made through a pursestring suture, passed the Hasson cannula, and then insufflated the abdomen.  With good insufflation and visibility, I passed the 5 mm trocar in the upper midline, through which the liver retractor went, two 10/11s in the right side, and one in the left upper quadrant, through which I performed the operation.  The patient was in the supine position.  I first took down the gastrohepatic ligament and carried this up along the right crus.  We reduced the stomach.  A prereduction picture is in the chart, where you can see the stomach sucked up into the chest.  The dissection proceeded anteriorly along the right crus and then carried this back across through what is the hiatal sac, and carried it along the top of the cardia.  At this point, I changed  course and went over and took down the short gastrics using the Harmonic scalpel and carried this up to the left crus and then on posteriorly to free up and mobilize that portion of the stomach.  This was done without too much difficulty.  I was then able to get around posterior to the stomach nicely and create a window.  I mobilized the stomach and put a Penrose drain around the stomach and pulled down on this to completely mobilize the cardia and get it out of the chest.  I then passed a lighted bougie so we could identify and make sure of the anatomy and we could make sure that the esophagus was in the abdomen.  This was then pulled back.  With the stomach pulled anteriorly, I placed first a suture which tightened the hiatus, but I felt like it was pinching the esophagus, and I went ahead and removed that and then placed two other Endostitch sutures, which significantly tightened the esophageal hiatus, and with the 50 bougie in place, this opening was snug.  At that point, I then created a wrap, and I wrapped this  above the aberrant hepatic vessel, which I had preserved.  This was wrapped superior to that and then sutured with the superiormost suture taking a purchase of periesophageal tissue and two sutures through the wrap.  An intracorporeal tie was done, and these three sutures were used to appose the wrapped stomach back with the fundus.  Extracorporeal ties were used in repairing the diaphragm.  The Endostitch device with standard Surgidac was utilized.   Essentially no bleeding was noted.  The integrity of the stomach was felt to be not violated, and there was no evidence of a leak or bleeding.  Pictures were taken to document the appearance of things at the end of the case.  The trocars were withdrawn, and the patient was then inspected as these came out, and no bleeding was seen.  The umbilical port was tied down with the pursestring suture and closed in that fashion.  All the  wounds were injected with Marcaine, and the skin was closed with 4-0 Vicryl with benzoin and Steri-Strips.  The patient tolerated the procedure well and was taken to the recovery room in satisfactory condition. DD:  01/10/00 TD:  01/13/00 Job: [id number]

## 2010-12-17 NOTE — Discharge Summary (Signed)
NAME:  Carrie, [name]                           ACCOUNT NO.:  [id number]   MEDICAL RECORD NO.:  [id number]                   PATIENT TYPE:  INP   LOCATION:  5021                                 FACILITY:  [facility]   PHYSICIAN:  Katy Fitch. Naaman Plummer., M.D.          DATE OF BIRTH:  1929-01-03   DATE OF ADMISSION:  03/18/2002  DATE OF DISCHARGE:                                 DISCHARGE SUMMARY   DISCHARGE DIAGNOSIS:  1. Comminuted open fracture of left distal radius.  2. Open fracture of left distal ulnar diaphyseal metaphyseal junction.  3. Carpal tunnel syndrome secondary to severely displaced distal double bone     forearm fracture, left.   OTHER DISCHARGE DIAGNOSIS:  Chronic Parkinson's syndrome, morbid obesity and  depression.   FINAL DISCHARGE DIAGNOSIS:  1. Comminuted open fracture of left distal radius.  2. Open fracture of left distal ulnar diaphyseal metaphyseal junction.  3. Carpal tunnel syndrome secondary to severely displaced distal double bone     forearm fracture, left.  4. Other discharge diagnoses - chronic Parkinson's syndrome, morbid obesity     and depression.   Please see admitting history and physical, laboratory data and operating  note.   Please note the following pertinent laboratory data at admission.   ADMISSION LABORATORY DATA:  Admission B-MET sodium 139, potassium 3.8,  chloride 107, BUN 22, glucose 105.  Hemoglobin at admission 14 grams  percent, hematocrit 41.   HOSPITAL COURSE:  The patient fell at home on March 18, 2002, sustaining an  open severely comminuted fracture of her left distal radius and distal ulnar  shaft fracture.  She was transferred from [facility], by  private vehicle after being evaluated at the [facility] emergency room  where x-rays were obtained revealing her fracture predicament.  Her  fractures were noted to be open.  She was given 1 g of Ancef by the  emergency room physician in [facility] and  transported with her daughter who  is a Oncologist to the [facility] short stay admission  center.   Because of multiple medical problems including chronic Parkinson's syndrome  and a history of previous severe anemia and high output heart failure she  was deemed a candidate for operative treatment at [facility] hospital.  After  anesthesia clearance by Dr. Noreene Larsson she was taken to the operating room on  the afternoon on 03/18/2002 and underwent irrigation and debridement of her  distal radius and ulnar fractures followed by ORIF of her right distal  radius fracture with a DVR volar plate system and ORIF of her ulna with an  intermedullary 5/32 Steinmann pin.  Due to the fact that she was  experiencing numbness in her hand and hematoma was noted within the carpal  canal a transverse carpal ligament release was performed.   Postoperatively drains were placed to her wound and open fracture wounds and  a  voluminous dressing applied with a sugar tong splint.  She was admitted  for IV antibiotic therapy times 72 hours and appropriate analgesic therapy.  Detour ambulatory difficulties prior to her injury secondary to her  Parkinson's syndrome as well as her past medical problems a relocation  consult was obtained from Dr. Thersa Salt.  Dr. Thersa Salt felt that due to the  fact that she would require more than two weeks of rehabilitative support  with physical and occupational therapy that an admission to the [facility]  extended care facility was more appropriate.   An appropriate consult was obtained with the care planners to transfer her  to the [facility] extended care facility on the afternoon of 03/21/2002 or early  on the morning of 03/22/2002.  Appropriate paperwork was completed to  facilitate this transfer.  Postoperatively she was noted to have stable  vital signs and was never noted to have an elevated temperature  postoperatively.  She was on IV Ancef times 72 hours and had strict   elevation of her hand.  She was placed from the bed to the chair ambulatory  status 12 hours following surgery and was assisted to the bathroom by the  nursing staff.   Her IV fluids were discontinued on the second postoperative day and her IV  antibiotics continued through Heparin well.  She will be transferred to  extended care as soon as her bed is available.  At the time of discharge she  is stable, awake and alert, eating her routine diet.  There are no apparent  complications noted.                                               Katy Fitch Naaman Plummer., M.D.    RVS/MEDQ  D:  03/21/2002  T:  03/22/2002  Job:  [id number]   cc:   Gerlene Burdock A. Jacky Kindle, M.D.

## 2010-12-17 NOTE — Op Note (Signed)
NAME:  Carrie [name], Carrie [name]                           ACCOUNT NO.:  [id number]   MEDICAL RECORD NO.:  [id number]                   PATIENT TYPE:  AMB   LOCATION:  DSC                                  FACILITY:  [facility]   PHYSICIAN:  Loreta Ave, M.D.              DATE OF BIRTH:  Mar 05, 1929   DATE OF PROCEDURE:  04/01/2004  DATE OF DISCHARGE:                                 OPERATIVE REPORT   PREOPERATIVE DIAGNOSES:  1.  Recurrent callosity, spurring, second toe.  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  No      evidence of infection.  2.  End stage degenerative arthritis, left knee.   POSTOPERATIVE DIAGNOSES:  1.  Recurrent callosity, spurring, second toe.  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  No      evidence of infection.  2.  End stage degenerative arthritis, left knee.   OPERATION PERFORMED:  1.  Revision amputation, second toe left foot with removal of callosity and      spur and then primary closure.  2.  Intra-articular injection of left knee with Synvisc viscous      supplementation.   SURGEON:  Loreta Ave, M.D.   ASSISTANT:  Arlys John D. Petrarca, P.A.-C.   ANESTHESIA:  Digital block of second toe with sedation.   SPECIMENS:  None.   CULTURES:  None.   COMPLICATIONS:  None.   DRESSING:  Soft compressive, wooden shoe.   DESCRIPTION OF PROCEDURE:  The patient was brought to the operating room and  after adequate anesthesia had been obtained, the lower leg on the left was  prepped and draped in the usual sterile fashion.  Digital block for  anesthesia placed preoperatively.  A small Penrose drain was used as a  tourniquet at the base of the second toe.  Elliptical excision of the  callosity at the end of the toe and surrounding thickened tissue exposing  the end of the toe middle phalanx.  The bone was then exposed  subperiosteally, rongeured down to a smooth  contoured surface, shortening  this sufficiently, so that it would not be a problem and hopefully will not  get recurrent symptoms.  Wound irrigated.  The soft tissue envelope which  had been exposed through the elliptical excision of the ulceration and  callosity at the end of her toe was then closed primarily with nylon.  Nice  closure and no bony prominence or soft tissue prominence at completion.  The  Penrose removed.  Sterile compressive dressing with wooden shoe  applied.  Under sterile technique, we then did an intra-articular injection  of her left knee with viscous supplementation, utilizing SynVisc after  prepping and anesthesia with Xylocaine.  Tolerated this well.  Anesthesia  reversed.  Brought to recovery room.  Tolerated surgery well without  complication.  Loreta Ave, M.D.    DFM/MEDQ  D:  04/01/2004  T:  04/02/2004  Job:  [id number]

## 2010-12-17 NOTE — H&P (Signed)
[facility]  Patient:    DAISY, LITES                        MRN: [id number] Adm. Date:  [id number] Disc. Date: [id number] Attending:  Katha Cabal                         History and Physical  DIAGNOSIS:  Large hiatal hernia with reflux.  HISTORY:  Ms. Nelms is a 77 year old lady who has a past history of congestive heart failure, Parkinsons disease, arthritis and lymphedema of the left lower extremity, who is admitted at this for repair of a large hiatal hernia, with a significant portion of her stomach in her chest.  ALLERGIES:  She is allergic to SULFA DRUGS.  CURRENT MEDICATIONS 1. Lasix 80 mg q.d. 2. Kay Ciel 20 mEq b.i.d. 3. Zoloft 100 mg q.d. 4. Sinemet CR 25/100 one-half tab t.i.d. 5. Mirapex 1 mg t.i.d. 6. Cipro 250 mg b.i.d. 7. Detrol 2 mg b.i.d. 8. Protonix one q.d.  PHYSICAL EXAMINATION  GENERAL:  Ms. Luebbe is a 77 year old lady, well-maintained, slightly overweight.  HEENT:  Unremarkable.  NECK:  Supple.  CHEST:  Clear to auscultation.  HEART:  Sinus rhythm.  ABDOMEN:  Obese, nontender.  EXTREMITIES:  Left lower extremity with lymphedema.  IMPRESSION:  Large hiatal hernia with gastroesophageal reflux disease.  PLAN:  Laparoscopic Nissen fundoplication repair of hiatal hernia. DD:  04/12/00 TD:  04/13/00 Job: [id number]

## 2011-05-12 LAB — COMPREHENSIVE METABOLIC PANEL
Albumin: 3.3 — ABNORMAL LOW
BUN: 22
Calcium: 8.8
Chloride: 103
Creatinine, Ser: 0.74
GFR calc Af Amer: >60
Total Bilirubin: 0.6

## 2011-05-12 LAB — CBC
HCT: 39.2
MCHC: 33.9
MCV: 95.4
Platelets: 180
RDW: 13.8
WBC: 5.6

## 2011-05-12 LAB — DIFFERENTIAL
Basophils Absolute: 0
Lymphocytes Relative: 22
Lymphs Abs: 1.3
Monocytes Absolute: 0.4
Neutro Abs: 3.9

## 2011-05-12 LAB — ABO/RH: ABO/RH(D): A POS

## 2011-05-12 LAB — TYPE AND SCREEN
ABO/RH(D): A POS
Antibody Screen: NEGATIVE

## 2011-05-12 LAB — PROTIME-INR: Prothrombin Time: 12.6

## 2011-05-12 LAB — APTT: aPTT: 32

## 2011-09-21 ENCOUNTER — Encounter (INDEPENDENT_AMBULATORY_CARE_PROVIDER_SITE_OTHER): Payer: Medicare Other | Admitting: Ophthalmology

## 2011-09-21 DIAGNOSIS — H43819 Vitreous degeneration, unspecified eye: Secondary | ICD-10-CM

## 2011-09-21 DIAGNOSIS — H3530 Unspecified macular degeneration: Secondary | ICD-10-CM

## 2012-11-30 ENCOUNTER — Ambulatory Visit: Payer: Self-pay | Admitting: Neurology

## 2013-05-01 ENCOUNTER — Encounter: Payer: Self-pay | Admitting: Neurology

## 2013-05-17 ENCOUNTER — Telehealth: Payer: Self-pay | Admitting: Neurology

## 2013-05-17 NOTE — Telephone Encounter (Signed)
Called pt to remind her of her appointment Tue 11:00 with dr Anne Hahn Phone# was busy, there were no alternate phone numbers.

## 2013-05-21 ENCOUNTER — Ambulatory Visit (INDEPENDENT_AMBULATORY_CARE_PROVIDER_SITE_OTHER): Payer: Medicare Other | Admitting: Neurology

## 2013-05-21 ENCOUNTER — Encounter: Payer: Self-pay | Admitting: Neurology

## 2013-05-21 VITALS — BP 135/76 | HR 67

## 2013-05-21 DIAGNOSIS — G20A1 Parkinson's disease without dyskinesia, without mention of fluctuations: Secondary | ICD-10-CM

## 2013-05-21 DIAGNOSIS — G20 Parkinson's disease: Secondary | ICD-10-CM

## 2013-05-21 DIAGNOSIS — R269 Unspecified abnormalities of gait and mobility: Secondary | ICD-10-CM

## 2013-05-21 HISTORY — DX: Unspecified abnormalities of gait and mobility: R26.9

## 2013-05-21 HISTORY — DX: Parkinson's disease without dyskinesia, without mention of fluctuations: G20.A1

## 2013-05-21 HISTORY — DX: Parkinson's disease: G20

## 2013-05-21 NOTE — Progress Notes (Signed)
Reason for visit: Parkinson's disease  Carrie [name] is an 79 y.o. female  History of present illness:  Carrie [name] is an 78 year old right-handed white female with a history of Parkinson's disease and a mild memory deficit. The patient indicates that she has been nonambulatory for least 4 or 5 years following lumbar surgery. The patient denies any significant back pain. The patient is noting some mild issues with swallowing liquids at times. The patient also notes an occasional episode of vivid dreams at night. Otherwise, the patient does not report any hallucinations. The patient has tremors of the upper extremities, but this does not prevent her from using her arms for feeding herself. The patient was last seen through this office in December 2012. The patient returns for an evaluation.  Past Medical History  Diagnosis Date  . Low back pain   . Neuropathy   . Endometrial cancer   . Hiatal hernia   . Nephritis     polynephritis right sided  . Paralysis agitans 05/21/2013  . Abnormality of gait 05/21/2013  . Obesity   . Memory deficit     Past Surgical History  Procedure Laterality Date  . Foot surgery      5 th digit rt toe amputation/sarcoma,left hammer toe   . L5 radiculopathy      status post surgery Dr. Noel Gerold 2006  . Abdominal hysterectomy      Re: endometrial cancer    History reviewed. No pertinent family history.  Social history:  reports that she has never smoked. She has never used smokeless tobacco. She reports that she does not drink alcohol or use illicit drugs.    Allergies  Allergen Reactions  . Sulfa Antibiotics Rash    Medications:  Current Outpatient Prescriptions on File Prior to Visit  Medication Sig Dispense Refill  . acetaminophen (TYLENOL) 650 MG CR tablet Take 650 mg by mouth 2 (two) times daily.      Marland Kitchen albuterol (PROVENTIL) (2.5 MG/3ML) 0.083% nebulizer solution Take 2.5 mg by nebulization every 6 (six) hours as needed for wheezing.      .  calcium-vitamin D (OSCAL WITH D) 500-200 MG-UNIT per tablet Take 1 tablet by mouth 2 (two) times daily.      . carbidopa-levodopa (SINEMET IR) 25-250 MG per tablet Take 1 tablet by mouth 3 (three) times daily. 1/2 @@ 9 am 12 noon, 5 pm, and 9 p m      . docusate sodium (COLACE) 100 MG capsule Take 100 mg by mouth 2 (two) times daily.      . furosemide (LASIX) 40 MG tablet Take 40 mg by mouth.      . hydroxypropyl methylcellulose (ISOPTO TEARS) 2.5 % ophthalmic solution Place 1 drop into both eyes 4 (four) times daily as needed.      Marland Kitchen omega-3 acid ethyl esters (LOVAZA) 1 G capsule Take 2 g by mouth 2 (two) times daily.      . Pediatric Multi Vit-Extra C-FA (CHILDRENS MULTIVITAMINS) W/EXTRA C & FA CHEW Chew 1 each by mouth daily.       No current facility-administered medications on file prior to visit.    ROS:  Out of a complete 14 system review of symptoms, the patient complains only of the following symptoms, and all other reviewed systems are negative.  Gait disorder Tremor Memory disorder  Blood pressure 135/76, pulse 67, weight 0 lb (0 kg).  Physical Exam  General: The patient is alert and cooperative at the time of  the examination. The patient is moderately to markedly obese.  Skin: No significant peripheral edema is noted.   Neurologic Exam  Mental status: The Mini-Mental status examination done today shows a total score of 27/30.  Cranial nerves: Facial symmetry is present. Speech is normal, no aphasia or dysarthria is noted. Extraocular movements are full. Visual fields are full.  Motor: The patient has good strength in all 4 extremities.  Sensory examination: Soft touch sensation is symmetric on the face, arms, and legs.  Coordination: The patient has good finger-nose-finger and heel-to-shin bilaterally.  Gait and station: The patient is wheelchair-bound, she could not be ambulated.  Reflexes: Deep tendon reflexes are symmetric, but are  depressed.   Assessment/Plan:  One. Parkinson's disease  2. Memory disorder  3. Gait disorder  The patient is nonambulatory, and aggressive treatment with Sinemet is therefore not indicated at this time. The patient is swallowing fairly well at this point. The patient does have a memory disorder, and for this reason, the Mirapex dosing will be reduced to help prevent problems with confusion or vivid dreams in the future. The patient will be reduced to 0.25 mg 3 times daily with the Mirapex. The patient will followup in 6 months. The patient remains on Sinemet 25/250 mg tablets, one half tablet 4 times daily.   [name] MD 05/21/2013 8:00 PM  [facility] [address] [place], [place] [address]  Phone [phone] Fax [phone]

## 2013-11-19 ENCOUNTER — Ambulatory Visit (INDEPENDENT_AMBULATORY_CARE_PROVIDER_SITE_OTHER): Payer: Medicare Other | Admitting: Neurology

## 2013-11-19 ENCOUNTER — Encounter (INDEPENDENT_AMBULATORY_CARE_PROVIDER_SITE_OTHER): Payer: Self-pay

## 2013-11-19 ENCOUNTER — Encounter: Payer: Self-pay | Admitting: Neurology

## 2013-11-19 DIAGNOSIS — R269 Unspecified abnormalities of gait and mobility: Secondary | ICD-10-CM

## 2013-11-19 DIAGNOSIS — R1310 Dysphagia, unspecified: Secondary | ICD-10-CM

## 2013-11-19 DIAGNOSIS — G20 Parkinson's disease: Secondary | ICD-10-CM

## 2013-11-19 MED ORDER — PRAMIPEXOLE DIHYDROCHLORIDE 0.125 MG PO TABS: 0.1250 mg | ORAL_TABLET | Freq: Three times a day (TID) | ORAL | Status: AC

## 2013-11-19 NOTE — Patient Instructions (Signed)

## 2013-11-19 NOTE — Progress Notes (Signed)
Reason for visit: Parkinson's disease  Carrie [name] is an 80 y.o. female  History of present illness:  Carrie [name] is an 80 year old right-handed white female with a history of Parkinson's disease, gait disturbance, and a memory disturbance. The patient is wheelchair-bound, but she can mobilize some using her arms and her legs while in the wheelchair. The patient requires a Hoya lift for transfers. The patient has not had any skin issues, and no falls. The patient does report some issues with sleeping at night, and she reports vivid dreams. The patient also indicates that she is having some problems with swallowing, but she does not know whether liquids or solids give her more problems. The patient will cough frequently while eating. The patient has not had any recent medical issues or hospitalizations, no history of recent pneumonia. The patient returns to this office for further evaluation. The patient does report some arthritic pain primarily associated with the knees.  Past Medical History  Diagnosis Date  . Low back pain   . Neuropathy   . Endometrial cancer   . Hiatal hernia   . Nephritis     polynephritis right sided  . Paralysis agitans 05/21/2013  . Abnormality of gait 05/21/2013  . Obesity   . Memory deficit     Past Surgical History  Procedure Laterality Date  . Foot surgery      5 th digit rt toe amputation/sarcoma,left hammer toe   . L5 radiculopathy      status post surgery Dr. Patrice Paradise 2006  . Abdominal hysterectomy      Re: endometrial cancer    History reviewed. No pertinent family history.  Social history:  reports that she has never smoked. She has never used smokeless tobacco. She reports that she does not drink alcohol or use illicit drugs.    Allergies  Allergen Reactions  . Sulfa Antibiotics Rash    Medications:  Current Outpatient Prescriptions on File Prior to Visit  Medication Sig Dispense Refill  . acetaminophen (TYLENOL) 650 MG CR tablet  Take 650 mg by mouth 2 (two) times daily.      Marland Kitchen albuterol (PROVENTIL) (2.5 MG/3ML) 0.083% nebulizer solution Take 2.5 mg by nebulization every 6 (six) hours as needed for wheezing.      . calcium-vitamin D (OSCAL WITH D) 500-200 MG-UNIT per tablet Take 1 tablet by mouth 2 (two) times daily.      . carbidopa-levodopa (SINEMET IR) 25-250 MG per tablet Take 1 tablet by mouth 3 (three) times daily. 1/2 @@ 9 am 12 noon, 5 pm, and 9 p m      . docusate sodium (COLACE) 100 MG capsule Take 100 mg by mouth 2 (two) times daily.      . furosemide (LASIX) 40 MG tablet Take 40 mg by mouth.      . hydroxypropyl methylcellulose (ISOPTO TEARS) 2.5 % ophthalmic solution Place 1 drop into both eyes 4 (four) times daily as needed.      Marland Kitchen omega-3 acid ethyl esters (LOVAZA) 1 G capsule Take 2 g by mouth 2 (two) times daily.      . Pediatric Multi Vit-Extra C-FA (CHILDRENS MULTIVITAMINS) W/EXTRA C & FA CHEW Chew 1 each by mouth daily.      . ranitidine (ZANTAC) 150 MG tablet Take 150 mg by mouth daily.      . traMADol (ULTRAM) 50 MG tablet Take 50 mg by mouth every 4 (four) hours as needed for pain.      Marland Kitchen  VOLTAREN 1 % GEL Apply 1 application topically as needed.       No current facility-administered medications on file prior to visit.    ROS:  Out of a complete 14 system review of symptoms, the patient complains only of the following symptoms, and all other reviewed systems are negative.  Gait disturbance Memory disturbance Vivid dreams  Blood pressure , height 0' (0 m), weight 0 lb (0 kg).  Physical Exam  General: The patient is alert and cooperative at the time of the examination. The patient is moderately obese.  Neuromuscular: The patient has restriction of abduction of the arms bilaterally, right greater than left. The patient is able to elevate the right arm to about 80, approximately 90 on the left.  Skin: No significant peripheral edema is noted.   Neurologic Exam  Mental status: The  patient is oriented x 3.  Cranial nerves: Facial symmetry is present. Speech is normal, no aphasia or dysarthria is noted. Extraocular movements are full. Visual fields are full. Masking of the face is seen. Dyskinesias involving the head and neck and shoulders are seen.  Motor: The Mini-Mental status examination done today shows a total score of 27/30. The animal fluency test reveals a score of 7.  Sensory examination: Soft touch sensation is symmetric on the face, arms, and legs.  Coordination: The patient has good finger-nose-finger bilaterally. The patient has difficulty performing heel-to-shin bilaterally.  Gait and station: The patient is wheelchair-bound, nonambulatory.  Reflexes: Deep tendon reflexes are symmetric, but are depressed.   Assessment/Plan:  One. Parkinson's disease  2. Memory disturbance  3. Gait disturbance  4. 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 The patient has dystonic posturing of the hands, and she has dyskinesias of the head and neck. The patient will be sent for a speech therapy evaluation for a modified barium swallow. The patient will followup in 5 months. In the future, the Sinemet dosing may also be reduced. The patient is currently taking the 25/250 mg tablets, one half tablet 4 times daily.  Jill Alexanders MD 11/19/2013 8:43 PM  [facility] [address] [place], [place] [address]  Phone [phone] Fax [phone]

## 2013-11-21 ENCOUNTER — Other Ambulatory Visit (HOSPITAL_COMMUNITY): Payer: Self-pay | Admitting: Neurology

## 2013-11-21 DIAGNOSIS — R1310 Dysphagia, unspecified: Secondary | ICD-10-CM

## 2013-11-22 ENCOUNTER — Telehealth: Payer: Self-pay | Admitting: *Deleted

## 2013-11-22 NOTE — Telephone Encounter (Signed)
Spoke with patient's son-in law who asked that I call back and leave appointment info on the ans. Machine. Barrium Swallow and Speech testing is scheduled for 11/26/13 @ [facility] 1st floor [phone].

## 2013-11-26 ENCOUNTER — Inpatient Hospital Stay (HOSPITAL_COMMUNITY): Admission: RE | Admit: 2013-11-26 | Payer: Medicare Other | Source: Ambulatory Visit

## 2013-11-26 ENCOUNTER — Ambulatory Visit (HOSPITAL_COMMUNITY): Payer: Medicare Other

## 2014-04-21 ENCOUNTER — Ambulatory Visit: Payer: Medicare Other | Admitting: Neurology

## 2014-04-22 ENCOUNTER — Encounter (INDEPENDENT_AMBULATORY_CARE_PROVIDER_SITE_OTHER): Payer: Self-pay

## 2014-04-22 ENCOUNTER — Encounter: Payer: Self-pay | Admitting: Neurology

## 2014-04-22 ENCOUNTER — Ambulatory Visit (INDEPENDENT_AMBULATORY_CARE_PROVIDER_SITE_OTHER): Payer: Medicare Other | Admitting: Neurology

## 2014-04-22 VITALS — BP 127/73 | HR 61

## 2014-04-22 DIAGNOSIS — G20 Parkinson's disease: Secondary | ICD-10-CM

## 2014-04-22 DIAGNOSIS — R269 Unspecified abnormalities of gait and mobility: Secondary | ICD-10-CM

## 2014-04-22 NOTE — Progress Notes (Signed)
Reason for visit: Parkinson's disease  Carrie [name] is an 80 y.o. female  History of present illness:  Carrie [name] is an 80 year old right-handed white female with a history of Parkinson's disease and a severe gait disorder. The patient is nonambulatory, and she resides in an extended care facility. She requires a lift for transfers from bed to chair and vice versa. She indicates that when she goes to bed, she is unable to roll over, and she remains flat on her back. She denies any skin breakdown issues. She does not sleep well at night, but this is mainly because her roommate gets up to go to the bathroom frequently. The patient denies any hallucinations or significant confusion. She denies any problems with swallowing, and she did have a swallowing evaluation at the extended care facility since her last visit. No modifications to her diet were made. The patient denies any chronic pain issues. She has noted an increase in the tremor as the Mirapex was reduced. She remains on a relatively low-dose of Sinemet taking the 25/250 mg tablets, one half tablet 4 times daily.  Past Medical History  Diagnosis Date  . Low back pain   . Neuropathy   . Endometrial cancer   . Hiatal hernia   . Nephritis     polynephritis right sided  . Paralysis agitans 05/21/2013  . Abnormality of gait 05/21/2013  . Obesity   . Memory deficit     Past Surgical History  Procedure Laterality Date  . Foot surgery      5 th digit rt toe amputation/sarcoma,left hammer toe   . L5 radiculopathy      status post surgery Dr. Patrice Paradise 2006  . Abdominal hysterectomy      Re: endometrial cancer    Family History  Problem Relation Age of Onset  . Cancer Father   . Cancer Sister     glioblastoma  . Cancer Brother     throat cancer  . Parkinsonism Neg Hx   . Cancer Sister     glioblastoma  . Cancer Brother   . Cancer Brother     Social history:  reports that she has never smoked. She has never used smokeless  tobacco. She reports that she does not drink alcohol or use illicit drugs.    Allergies  Allergen Reactions  . Sulfa Antibiotics Rash    Medications:  Current Outpatient Prescriptions on File Prior to Visit  Medication Sig Dispense Refill  . acetaminophen (TYLENOL) 650 MG CR tablet Take 650 mg by mouth 2 (two) times daily.      Marland Kitchen albuterol (PROVENTIL) (2.5 MG/3ML) 0.083% nebulizer solution Take 2.5 mg by nebulization every 6 (six) hours as needed for wheezing.      . calcium-vitamin D (OSCAL WITH D) 500-200 MG-UNIT per tablet Take 1 tablet by mouth 2 (two) times daily.      . carbidopa-levodopa (SINEMET IR) 25-250 MG per tablet Take 1 tablet by mouth 3 (three) times daily. 1/2 @@ 9 am 12 noon, 5 pm, and 9 p m      . docusate sodium (COLACE) 100 MG capsule Take 100 mg by mouth 2 (two) times daily.      . furosemide (LASIX) 40 MG tablet Take 40 mg by mouth.      . hydroxypropyl methylcellulose (ISOPTO TEARS) 2.5 % ophthalmic solution Place 1 drop into both eyes 4 (four) times daily as needed.      . Multiple Vitamin (MULTIVITAMIN) tablet Take 1  tablet by mouth daily.      Marland Kitchen omega-3 acid ethyl esters (LOVAZA) 1 G capsule Take 2 g by mouth 2 (two) times daily.      . Pediatric Multi Vit-Extra C-FA (CHILDRENS MULTIVITAMINS) W/EXTRA C & FA CHEW Chew 1 each by mouth daily.      . pramipexole (MIRAPEX) 0.125 MG tablet Take 1 tablet (0.125 mg total) by mouth 3 (three) times daily.      . ranitidine (ZANTAC) 150 MG tablet Take 150 mg by mouth daily.      . traMADol (ULTRAM) 50 MG tablet Take 50 mg by mouth every 4 (four) hours as needed for pain.      Marland Kitchen VOLTAREN 1 % GEL Apply 1 application topically as needed.       No current facility-administered medications on file prior to visit.    ROS:  Out of a complete 14 system review of symptoms, the patient complains only of the following symptoms, and all other reviewed systems are negative.  Runny nose, drooling Loss of vision Shortness of  breath Leg swelling Joint pain, achy muscles, walking difficulties Anemia Memory loss, numbness, tremors, gait disorder  Blood pressure 127/73, pulse 61, weight 0 lb (0 kg).  Physical Exam  General: The patient is alert and cooperative at the time of the examination. The patient is moderately obese.  Neuromuscular: The patient is able to abduct the arms only to about 80 bilaterally.  Skin: The patient has 3+ edema in the left leg, 2+ edema in the right leg.   Neurologic Exam  Mental status: The Mini-Mental status examination done today shows a total score of 28/30.  Cranial nerves: Facial symmetry is present. Speech is normal, no aphasia or dysarthria is noted. Extraocular movements are full. Visual fields are full. Masking of the face is seen.  Motor: The patient has good strength in all 4 extremities.  Sensory examination: Soft touch sensation is symmetric on the face, arms, and legs.  Coordination: The patient has good finger-nose-finger and heel-to-shin bilaterally. Resting tremors were seen with both upper extremities.  Gait and station: The patient is nonambulatory, wheelchair-bound.  Reflexes: Deep tendon reflexes are symmetric, but are depressed.   Assessment/Plan:  One. Parkinson's disease  2. Gait disorder  3. Mild memory disturbance  The patient remains quite incapacitated physically with her Parkinson's disease. She is nonambulatory, but she denies any reports of difficulty swallowing. The patient has a mild memory disturbance, this will need to be followed over time, and she may be placed on Exelon in the future if the memory continues to deteriorate. She will followup in about 5-6 months. No changes in her Parkinson's medications were made today. The patient requires assistance with bathing, dressing, but she is able to feed herself.  Carrie [name] 04/22/2014 7:44 PM  [facility] [address] [place],   [address]  Phone [phone] Fax [phone]

## 2014-04-22 NOTE — Patient Instructions (Signed)
Parkinson Disease Parkinson disease is a disorder of the brain and spinal cord (central nervous system). The person will slowly lose the ability to control his or her body movements. This happens due to:  Damaged nerve cells.  Low levels of a certain brain chemical. HOME CARE  Exercise often.  Make time to rest during the day.  Take all medicine as told by your doctor.  Replace buttons and zippers with elastic and Velcro if getting dressed is difficult.  Put grab bars or rails in your home. This helps you to not fall.  Go to speech therapy or therapy to help you with daily activities (occupational therapy). Do this as told by your doctor.  Keep all doctor visits as told. GET HELP IF:  Your medicine does not help your symptoms.  You fall.  You have trouble swallowing or choke on your food. MAKE SURE YOU:  Understand these instructions.  Will watch your condition.  Will get help right away if you are not doing well or get worse. Document Released: 10/10/2011 Document Revised: 11/12/2012 Document Reviewed: 10/10/2011 Exit[employer] Patient Information 2015 [employer]. This information is not intended to replace advice given to you by your health care provider. Make sure you discuss any questions you have with your health care provider.  

## 2014-06-18 ENCOUNTER — Encounter: Payer: Self-pay | Admitting: Neurology

## 2014-06-24 ENCOUNTER — Encounter: Payer: Self-pay | Admitting: Neurology

## 2014-09-22 ENCOUNTER — Ambulatory Visit: Payer: Medicare Other | Admitting: Neurology

## 2014-09-25 ENCOUNTER — Other Ambulatory Visit (HOSPITAL_COMMUNITY): Payer: Self-pay | Admitting: Internal Medicine

## 2014-09-25 DIAGNOSIS — R748 Abnormal levels of other serum enzymes: Secondary | ICD-10-CM

## 2014-09-26 ENCOUNTER — Ambulatory Visit: Payer: Self-pay | Admitting: Neurology

## 2014-10-07 ENCOUNTER — Other Ambulatory Visit: Payer: Self-pay | Admitting: Gastroenterology

## 2014-10-07 NOTE — Addendum Note (Signed)
Addended by: Arta Silence on: 10/07/2014 12:19 PM   Modules accepted: Orders

## 2014-10-08 ENCOUNTER — Encounter (HOSPITAL_COMMUNITY): Payer: Self-pay

## 2014-10-08 ENCOUNTER — Ambulatory Visit (HOSPITAL_COMMUNITY): Payer: Medicare Other | Admitting: Anesthesiology

## 2014-10-08 ENCOUNTER — Encounter (HOSPITAL_COMMUNITY)
Admission: RE | Disposition: A | Discharge status: Hospice | Payer: Self-pay | Source: Ambulatory Visit | Attending: Internal Medicine

## 2014-10-08 ENCOUNTER — Inpatient Hospital Stay (HOSPITAL_COMMUNITY)
Admission: RE | Admit: 2014-10-08 | Discharge: 2014-10-12 | DRG: 408 | Disposition: A | Discharge status: Hospice | Payer: Medicare Other | Source: Ambulatory Visit | Attending: Internal Medicine | Admitting: Internal Medicine

## 2014-10-08 DIAGNOSIS — G629 Polyneuropathy, unspecified: Secondary | ICD-10-CM | POA: Diagnosis present

## 2014-10-08 DIAGNOSIS — K449 Diaphragmatic hernia without obstruction or gangrene: Secondary | ICD-10-CM | POA: Diagnosis present

## 2014-10-08 DIAGNOSIS — R6881 Early satiety: Secondary | ICD-10-CM | POA: Diagnosis present

## 2014-10-08 DIAGNOSIS — F419 Anxiety disorder, unspecified: Secondary | ICD-10-CM | POA: Diagnosis present

## 2014-10-08 DIAGNOSIS — I959 Hypotension, unspecified: Secondary | ICD-10-CM

## 2014-10-08 DIAGNOSIS — E871 Hypo-osmolality and hyponatremia: Secondary | ICD-10-CM | POA: Diagnosis present

## 2014-10-08 DIAGNOSIS — K838 Other specified diseases of biliary tract: Secondary | ICD-10-CM

## 2014-10-08 DIAGNOSIS — R627 Adult failure to thrive: Secondary | ICD-10-CM | POA: Diagnosis present

## 2014-10-08 DIAGNOSIS — E876 Hypokalemia: Secondary | ICD-10-CM | POA: Diagnosis not present

## 2014-10-08 DIAGNOSIS — Z6834 Body mass index (BMI) 34.0-34.9, adult: Secondary | ICD-10-CM | POA: Diagnosis not present

## 2014-10-08 DIAGNOSIS — Z515 Encounter for palliative care: Secondary | ICD-10-CM

## 2014-10-08 DIAGNOSIS — Z800 Family history of malignant neoplasm of digestive organs: Secondary | ICD-10-CM

## 2014-10-08 DIAGNOSIS — R1011 Right upper quadrant pain: Secondary | ICD-10-CM | POA: Diagnosis present

## 2014-10-08 DIAGNOSIS — Z66 Do not resuscitate: Secondary | ICD-10-CM | POA: Diagnosis present

## 2014-10-08 DIAGNOSIS — Z808 Family history of malignant neoplasm of other organs or systems: Secondary | ICD-10-CM

## 2014-10-08 DIAGNOSIS — E669 Obesity, unspecified: Secondary | ICD-10-CM | POA: Diagnosis present

## 2014-10-08 DIAGNOSIS — E860 Dehydration: Secondary | ICD-10-CM | POA: Diagnosis present

## 2014-10-08 DIAGNOSIS — G20 Parkinson's disease: Secondary | ICD-10-CM | POA: Diagnosis present

## 2014-10-08 DIAGNOSIS — K831 Obstruction of bile duct: Secondary | ICD-10-CM | POA: Diagnosis present

## 2014-10-08 DIAGNOSIS — Z993 Dependence on wheelchair: Secondary | ICD-10-CM

## 2014-10-08 DIAGNOSIS — K5900 Constipation, unspecified: Secondary | ICD-10-CM | POA: Diagnosis present

## 2014-10-08 DIAGNOSIS — I950 Idiopathic hypotension: Secondary | ICD-10-CM | POA: Diagnosis not present

## 2014-10-08 DIAGNOSIS — C801 Malignant (primary) neoplasm, unspecified: Secondary | ICD-10-CM | POA: Diagnosis present

## 2014-10-08 DIAGNOSIS — R630 Anorexia: Secondary | ICD-10-CM | POA: Diagnosis present

## 2014-10-08 DIAGNOSIS — C259 Malignant neoplasm of pancreas, unspecified: Principal | ICD-10-CM | POA: Diagnosis present

## 2014-10-08 DIAGNOSIS — R5383 Other fatigue: Secondary | ICD-10-CM | POA: Diagnosis present

## 2014-10-08 DIAGNOSIS — Z79899 Other long term (current) drug therapy: Secondary | ICD-10-CM | POA: Diagnosis not present

## 2014-10-08 DIAGNOSIS — Z881 Allergy status to other antibiotic agents status: Secondary | ICD-10-CM | POA: Diagnosis not present

## 2014-10-08 DIAGNOSIS — I9581 Postprocedural hypotension: Secondary | ICD-10-CM | POA: Diagnosis not present

## 2014-10-08 DIAGNOSIS — R0989 Other specified symptoms and signs involving the circulatory and respiratory systems: Secondary | ICD-10-CM

## 2014-10-08 HISTORY — DX: Parkinson's disease: G20

## 2014-10-08 HISTORY — DX: Parkinson's disease without dyskinesia, without mention of fluctuations: G20.A1

## 2014-10-08 HISTORY — PX: EUS: SHX5427

## 2014-10-08 HISTORY — DX: Malignant neoplasm of connective and soft tissue, unspecified: C49.9

## 2014-10-08 LAB — COMPREHENSIVE METABOLIC PANEL
ALT: 30 U/L (ref 0–35)
ANION GAP: 9 (ref 5–15)
AST: 140 U/L — ABNORMAL HIGH (ref 0–37)
Albumin: 2.2 g/dL — ABNORMAL LOW (ref 3.5–5.2)
Alkaline Phosphatase: 382 U/L — ABNORMAL HIGH (ref 39–117)
BUN: 19 mg/dL (ref 6–23)
CO2: 26 mmol/L (ref 19–32)
Calcium: 8.6 mg/dL (ref 8.4–10.5)
Chloride: 97 mmol/L (ref 96–112)
Creatinine, Ser: 0.41 mg/dL — ABNORMAL LOW (ref 0.50–1.10)
GFR calc Af Amer: >90 mL/min (ref >90)
Glucose, Bld: 109 mg/dL — ABNORMAL HIGH (ref 70–99)
Potassium: 3.3 mmol/L — ABNORMAL LOW (ref 3.5–5.1)
Sodium: 132 mmol/L — ABNORMAL LOW (ref 135–145)
TOTAL PROTEIN: 5.8 g/dL — AB (ref 6.0–8.3)
Total Bilirubin: 25.6 mg/dL (ref 0.3–1.2)

## 2014-10-08 LAB — CBC
HCT: 36.8 % (ref 36.0–46.0)
HEMOGLOBIN: 12.6 g/dL (ref 12.0–15.0)
MCH: 31.5 pg (ref 26.0–34.0)
MCHC: 34.2 g/dL (ref 30.0–36.0)
MCV: 92 fL (ref 78.0–100.0)
Platelets: 153 10*3/uL (ref 150–400)
RBC: 4 MIL/uL (ref 3.87–5.11)
RDW: 18.2 % — ABNORMAL HIGH (ref 11.5–15.5)
WBC: 6.5 10*3/uL (ref 4.0–10.5)

## 2014-10-08 LAB — PROTIME-INR
INR: 0.93 (ref 0.00–1.49)
Prothrombin Time: 12.6 seconds (ref 11.6–15.2)

## 2014-10-08 LAB — MAGNESIUM: MAGNESIUM: 2.3 mg/dL (ref 1.5–2.5)

## 2014-10-08 SURGERY — ESOPHAGEAL ENDOSCOPIC ULTRASOUND (EUS) RADIAL
Anesthesia: General

## 2014-10-08 MED ORDER — OXYCODONE HCL 5 MG PO TABS
5.0000 mg | ORAL_TABLET | ORAL | Status: DC | PRN
  Filled 2014-10-08: qty 1

## 2014-10-08 MED ORDER — POLYVINYL ALCOHOL 1.4 % OP SOLN
2.0000 [drp] | Freq: Three times a day (TID) | OPHTHALMIC | Status: DC
  Administered 2014-10-08 – 2014-10-12 (×9): 2 [drp] via OPHTHALMIC
  Filled 2014-10-08: qty 15

## 2014-10-08 MED ORDER — LACTATED RINGERS IV SOLN
INTRAVENOUS | Status: DC
  Administered 2014-10-08: 1000 mL via INTRAVENOUS

## 2014-10-08 MED ORDER — LIDOCAINE HCL (CARDIAC) 20 MG/ML IV SOLN
INTRAVENOUS | Status: AC
  Filled 2014-10-08: qty 5

## 2014-10-08 MED ORDER — ONDANSETRON HCL 4 MG PO TABS
4.0000 mg | ORAL_TABLET | Freq: Four times a day (QID) | ORAL | Status: DC | PRN
Start: 2014-10-08 — End: 2014-10-12

## 2014-10-08 MED ORDER — GLUCAGON HCL RDNA (DIAGNOSTIC) 1 MG IJ SOLR
INTRAMUSCULAR | Status: AC
  Filled 2014-10-08: qty 1

## 2014-10-08 MED ORDER — SUCCINYLCHOLINE CHLORIDE 20 MG/ML IJ SOLN
INTRAMUSCULAR | Status: DC | PRN
  Administered 2014-10-08: 60 mg via INTRAVENOUS

## 2014-10-08 MED ORDER — BISACODYL 5 MG PO TBEC: 5.0000 mg | DELAYED_RELEASE_TABLET | Freq: Every day | ORAL | Status: DC | PRN

## 2014-10-08 MED ORDER — CARBOXYMETHYLCELLULOSE SODIUM 0.5 % OP SOLN: 2.0000 [drp] | Freq: Three times a day (TID) | OPHTHALMIC | Status: DC

## 2014-10-08 MED ORDER — FENTANYL CITRATE 0.05 MG/ML IJ SOLN
INTRAMUSCULAR | Status: DC | PRN
  Administered 2014-10-08: 50 ug via INTRAVENOUS

## 2014-10-08 MED ORDER — CARBIDOPA-LEVODOPA 25-250 MG PO TABS
1.0000 | ORAL_TABLET | Freq: Three times a day (TID) | ORAL | Status: DC
  Administered 2014-10-08 – 2014-10-12 (×14): 1 via ORAL
  Filled 2014-10-08 (×19): qty 1

## 2014-10-08 MED ORDER — PRAMIPEXOLE DIHYDROCHLORIDE 0.125 MG PO TABS
0.1250 mg | ORAL_TABLET | Freq: Three times a day (TID) | ORAL | Status: DC
  Administered 2014-10-08 – 2014-10-12 (×11): 0.125 mg via ORAL
  Filled 2014-10-08 (×14): qty 1

## 2014-10-08 MED ORDER — ADULT MULTIVITAMIN W/MINERALS CH
1.0000 | ORAL_TABLET | Freq: Every day | ORAL | Status: DC
  Administered 2014-10-08: 1 via ORAL
  Filled 2014-10-08 (×2): qty 1

## 2014-10-08 MED ORDER — ALUM & MAG HYDROXIDE-SIMETH 200-200-20 MG/5ML PO SUSP: 30.0000 mL | Freq: Four times a day (QID) | ORAL | Status: DC | PRN

## 2014-10-08 MED ORDER — LIDOCAINE HCL (PF) 2 % IJ SOLN
INTRAMUSCULAR | Status: DC | PRN
  Administered 2014-10-08: 40 mg via INTRADERMAL

## 2014-10-08 MED ORDER — PIPERACILLIN-TAZOBACTAM 3.375 G IVPB
3.3750 g | Freq: Once | INTRAVENOUS | Status: AC
  Administered 2014-10-09: 3.375 g via INTRAVENOUS
  Filled 2014-10-08: qty 50

## 2014-10-08 MED ORDER — DEXTROSE-NACL 5-0.45 % IV SOLN
INTRAVENOUS | Status: DC
Start: 2014-10-08 — End: 2014-10-09
  Administered 2014-10-08 – 2014-10-09 (×3): via INTRAVENOUS

## 2014-10-08 MED ORDER — POLYETHYLENE GLYCOL 3350 17 G PO PACK: 17.0000 g | PACK | Freq: Every day | ORAL | Status: DC | PRN

## 2014-10-08 MED ORDER — ONDANSETRON HCL 4 MG/2ML IJ SOLN
INTRAMUSCULAR | Status: AC
  Filled 2014-10-08: qty 2

## 2014-10-08 MED ORDER — ONDANSETRON HCL 4 MG/2ML IJ SOLN
4.0000 mg | Freq: Four times a day (QID) | INTRAMUSCULAR | Status: DC | PRN
  Administered 2014-10-11: 4 mg via INTRAVENOUS
  Filled 2014-10-08 (×2): qty 2

## 2014-10-08 MED ORDER — FAMOTIDINE 20 MG PO TABS
20.0000 mg | ORAL_TABLET | Freq: Every day | ORAL | Status: DC
  Administered 2014-10-08 – 2014-10-11 (×3): 20 mg via ORAL
  Filled 2014-10-08 (×4): qty 1

## 2014-10-08 MED ORDER — PROPOFOL 10 MG/ML IV BOLUS
INTRAVENOUS | Status: AC
  Filled 2014-10-08: qty 20

## 2014-10-08 MED ORDER — FENTANYL CITRATE 0.05 MG/ML IJ SOLN: 25.0000 ug | INTRAMUSCULAR | Status: DC | PRN

## 2014-10-08 MED ORDER — SERTRALINE HCL 50 MG PO TABS
50.0000 mg | ORAL_TABLET | Freq: Every day | ORAL | Status: DC
  Administered 2014-10-08 – 2014-10-12 (×4): 50 mg via ORAL
  Filled 2014-10-08 (×5): qty 1

## 2014-10-08 MED ORDER — LACTULOSE 10 GM/15ML PO SOLN
30.0000 g | Freq: Every day | ORAL | Status: DC
  Administered 2014-10-08: 30 g via ORAL
  Filled 2014-10-08 (×2): qty 45

## 2014-10-08 MED ORDER — CIPROFLOXACIN IN D5W 400 MG/200ML IV SOLN
INTRAVENOUS | Status: AC
  Filled 2014-10-08: qty 200

## 2014-10-08 MED ORDER — ONDANSETRON HCL 4 MG/2ML IJ SOLN
INTRAMUSCULAR | Status: DC | PRN
  Administered 2014-10-08: 4 mg via INTRAVENOUS

## 2014-10-08 MED ORDER — PROPOFOL 10 MG/ML IV BOLUS
INTRAVENOUS | Status: DC | PRN
  Administered 2014-10-08: 120 mg via INTRAVENOUS

## 2014-10-08 MED ORDER — FENTANYL CITRATE 0.05 MG/ML IJ SOLN
INTRAMUSCULAR | Status: AC
  Filled 2014-10-08: qty 2

## 2014-10-08 MED ORDER — DOCUSATE SODIUM 100 MG PO CAPS
100.0000 mg | ORAL_CAPSULE | Freq: Two times a day (BID) | ORAL | Status: DC
  Filled 2014-10-08 (×2): qty 1

## 2014-10-08 MED ORDER — SODIUM CHLORIDE 0.9 % IV SOLN: INTRAVENOUS | Status: DC

## 2014-10-08 NOTE — Consult Note (Signed)
Reason for consult: PTC with biliary drainage  Referring Physician(s): Dr. Paulita Fujita  History of Present Illness: Carrie [name] is a 81 y.o. female admitted with obstructive jaundice, fatigue/worsening weakness, itching, persistent abdominal pain, constipation, intermittent nausea as well as easy bruisability. Recent endoscopic ultrasound revealed large complex hiatal hernia, and amorphous and ill-defined porta hepatis and superior portion of the pancreatic head as well as areas of necrosis. There was no clear delineation of the extrahepatic bile duct, specifically common hepatic duct or biliary confluence. The dilated common hepatic duct was not able to be visualized. Due to these factors ERCP was not performed. Request has now been received for PTC with biliary drainage. LFTs are currently pending.  Past Medical History  Diagnosis Date  . Low back pain   . Neuropathy   . Endometrial cancer   . Hiatal hernia   . Nephritis     polynephritis right sided  . Paralysis agitans 05/21/2013  . Abnormality of gait 05/21/2013  . Obesity   . Memory deficit   . 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 Parkinson's disease     Past Surgical History  Procedure Laterality Date  . Foot surgery      5 th digit rt toe amputation/sarcoma,left hammer toe   . L5 radiculopathy      status post surgery Dr. Patrice Paradise 2006  . Abdominal hysterectomy      Re: endometrial cancer    Allergies: Sulfa antibiotics  Medications: Prior to Admission medications   Medication Sig Start Date End Date Taking? Authorizing Provider  acetaminophen (TYLENOL) 650 MG CR tablet Take 650 mg by mouth 2 (two) times daily.   Yes Historical Provider, MD  calcium-vitamin D (OSCAL WITH D) 500-200 MG-UNIT per tablet Take 1 tablet by mouth 2 (two) times daily.   Yes Historical Provider, MD  carbidopa-levodopa (SINEMET IR) 25-250 MG per tablet Take 1 tablet by mouth 4 (four) times daily - after meals and at bedtime. 1 @@ 0900, 1300,  1700, 2100   Yes Historical Provider, MD  carboxymethylcellulose (REFRESH PLUS) 0.5 % SOLN Place 2 drops into both eyes 3 (three) times daily.   Yes Historical Provider, MD  CefTRIAXone Sodium (ROCEPHIN IJ) Inject 1 g as directed daily. X 10 days.   Yes Historical Provider, MD  Multiple Vitamin (MULTIVITAMIN) tablet Take 1 tablet by mouth daily.   Yes Historical Provider, MD  potassium chloride (MICRO-K) 10 MEQ CR capsule Take 10 mEq by mouth every morning.   Yes Historical Provider, MD  pramipexole (MIRAPEX) 0.125 MG tablet Take 1 tablet (0.125 mg total) by mouth 3 (three) times daily. Patient taking differently: Take 0.125 mg by mouth 3 (three) times daily. @ 0900, 1300, 2100 11/19/13  Yes Kathrynn Ducking, MD  ranitidine (ZANTAC) 150 MG tablet Take 150 mg by mouth daily. 05/19/13  Yes Historical Provider, MD  Vitamin D, Ergocalciferol, (DRISDOL) 50000 UNITS CAPS capsule Take 50,000 Units by mouth every 30 (thirty) days.   Yes Historical Provider, MD  docusate sodium (COLACE) 100 MG capsule Take 100 mg by mouth 2 (two) times daily.    Historical Provider, MD     Family History  Problem Relation Age of Onset  . Cancer Father     stomach  . Cancer Sister     glioblastoma  . Cancer Brother     throat cancer  . Parkinsonism Neg Hx   . Cancer Sister     glioblastoma  .  Cancer Brother     pancreatic  . Cancer Brother     History   Social History  . Marital Status: Widowed    Spouse Name: N/A  . Number of Children: 2  . Years of Education: hs   Occupational History  . retired    Social History Main Topics  . Smoking status: Never Smoker   . Smokeless tobacco: Never Used  . Alcohol Use: No  . Drug Use: No  . Sexual Activity: No   Other Topics Concern  . None   Social History Narrative        Review of Systems  see above  Vital Signs: BP 120/78 mmHg  Pulse 74  Temp(Src) 98.4 F (36.9 C) (Oral)  Resp 18  SpO2 99%  Physical Exam patient awake but lethargic and  weak appearing, markedly jaundiced; clear to auscultation bilaterally anteriorly. Heart with regular rate and rhythm. Abdomen soft, positive bowel sounds, mild generalized tenderness to palpation; left foot contracted. Trace lower extremity edema more so on left. Few scattered ecchymotic areas noted.  Mallampati Score:  MD Evaluation Airway: WNL Heart: WNL Abdomen: Other (comments) Abdomen comments: mild lower abdominal tenderness (chronic, unchanges since seen last week). Chest/ Lungs: WNL Other Pertinent Findings: jaundiced ASA  Classification: Per MD or Designee Mallampati/Airway Score: Two  Imaging: No results found.  Labs:  CBC:  Recent Labs  10/08/14 1532  WBC 6.5  HGB 12.6  HCT 36.8  PLT 153    COAGS: No results for input(s): INR, APTT in the last 8760 hours.  BMP: No results for input(s): NA, K, CL, CO2, GLUCOSE, BUN, CALCIUM, CREATININE, GFRNONAA, GFRAA in the last 8760 hours.  Invalid input(s): CMP  LIVER FUNCTION TESTS: No results for input(s): BILITOT, AST, ALT, ALKPHOS, PROT, ALBUMIN in the last 8760 hours.  TUMOR MARKERS: No results for input(s): AFPTM, CEA, CA199, CHROMGRNA in the last 8760 hours.  Assessment and Plan: Carrie [name] is a 81 y.o. female admitted with obstructive jaundice, fatigue/worsening weakness, itching, persistent abdominal pain, constipation, intermittent nausea as well as easy bruisability. Recent endoscopic ultrasound revealed large complex hiatal hernia, and amorphous and ill-defined porta hepatis and superior portion of the pancreatic head as well as areas of necrosis. There was no clear delineation of the extrahepatic bile duct, specifically common hepatic duct or biliary confluence. The dilated common hepatic duct was not able to be visualized. Due to these factors ERCP was not performed. Request has now been received for PTC with biliary drainage. LFTs are currently pending. Patient is afebrile and white blood cell count is  normal. Recent imaging studies were reviewed by Dr. Kathlene Cote and case was discussed with Dr. Paulita Fujita. Tentative plan at this time is for [facility] with biliary drain placement on 10/09/2014. Details/risks of procedure, including but not limited to internal bleeding, infection/sepsis, were discussed with patient and patient's daughter with their understanding and consent.    Signed: Autumn Messing 10/08/2014, 4:31 PM   I spent a total of 20 minutes  in face to face in clinical consultation, greater than 50% of which was counseling/coordinating care for percutaneous transhepatic cholangiogram with biliary drainage.

## 2014-10-08 NOTE — Transfer of Care (Signed)
Immediate Anesthesia Transfer of Care Note  Patient: Carrie [name]  Procedure(s) Performed: Procedure(s) (LRB): ESOPHAGEAL ENDOSCOPIC ULTRASOUND (EUS) RADIAL (N/A)  Patient Location: PACU  Anesthesia Type: General  Level of Consciousness: sedated, patient cooperative and responds to stimulation  Airway & Oxygen Therapy: Patient Spontanous Breathing and Patient connected to face mask oxgen  Post-op Assessment: Report given to PACU RN and Post -op Vital signs reviewed and stable  Post vital signs: Reviewed and stable  Complications: No apparent anesthesia complications

## 2014-10-08 NOTE — H&P (Signed)
Patient interval history reviewed.  Patient examined again.  There has been no change from documented H/P dated 10/03/14 (scanned into chart from our office) except as documented above.  Assessment:  1.  Obstructive jaundice. 2.  Suspected mass at level of common hepatic duct.  Plan:  1.  Endoscopic ultrasound for possible fine needle aspiration biopsies. 2.  Risks (bleeding, infection, bowel perforation that could require surgery, sedation-related changes in cardiopulmonary systems), benefits (identification and possible treatment of source of symptoms, exclusion of certain causes of symptoms), and alternatives (watchful waiting, radiographic imaging studies, empiric medical treatment) of upper endoscopy with ultrasound and possible biopsies (EUS +/- FNA) were explained to patient/family in detail and patient wishes to proceed. 3.  Pending findings on EUS, might consider endoscopic retrograde cholangiopancreatography for bile duct stenting. 4.  Risks (up to and including bleeding, infection, perforation, pancreatitis that can be complicated by infected necrosis and death), benefits (removal of stones, alleviating blockage, decreasing risk of cholangitis or choledocholithiasis-related pancreatitis), and alternatives (watchful waiting, percutaneous transhepatic cholangiography) of ERCP were explained to patient/family in detail and patient elects to proceed.

## 2014-10-08 NOTE — Op Note (Signed)
[place], [id number]   ENDOSCOPIC ULTRASOUND PROCEDURE REPORT  PATIENT: Carrie [name], Carrie [name]  MR#: [id number] BIRTHDATE: [birth year]  GENDER: female ENDOSCOPIST: Arta Silence, MD REFERRED BY:  Raelene Bott, M.D. PROCEDURE DATE:  10/08/2014 PROCEDURE:   Upper EUS ASA CLASS:      Class III INDICATIONS:   1.  obstructive jaundice. MEDICATIONS: General endotracheal anesthesia  DESCRIPTION OF PROCEDURE:   After the risks benefits and alternatives of the procedure were  explained, informed consent was obtained. The patient was then placed in the left, lateral, decubitus postion and IV sedation was administered. Throughout the procedure, the patients blood pressure, pulse and oxygen saturations were monitored continuously.  Under direct visualization, the EUS scope [id number]  endoscope was introduced through the mouth  and advanced to the second portion of the duodenum      .  Water was used as necessary to provide an acoustic interface.  Upon completion of the imaging, water was removed and the patient was sent to the recovery room in satisfactory condition.   FINDINGS:      Incidental notation made of large and complex-seeming hiatal hernia.  With time, duodenum was reached.  The porta hepatis and superior portion of the pancreatic head were very amorphous and ill-defined.  There was no well-defined anatomy in this area. There were several anechoic spaces consistent with necrosis.  Only the very distal bile duct was able to be identified.  Otherwise, there was no other clear delination of the extrahepatic bile duct, specifically no visualization of the common hepatic duct or biliary confluence; my suspicion is that tumor might be invading these areas as well.  Unable to visualize the severely dilated common hepatic duct that was seen on patient's recent MRCP.    Given extensive necrosis and lack of well-defined anatomy, as well as my suspicion  that the lesion involves the biliary confluence, I felt the risks of attempted ERCP for biliary decompression would outweigh the benefits.  IMPRESSION:     As above.  RECOMMENDATIONS:     1.  Watch for potential complications of procedure. 2.  Admit to [facility]; I have spoken with Dr. Sheran Fava; I thank the hospitalists for their assistance. 3.  Interventional Radiology consultation for consideration of percutaneous biliary drain; I have spoken with Dr. Kathlene Cote. 4.  [facility] will follow in consultation.   _______________________________ Lorrin MaisArta Silence, MD 10/08/2014 12:48 PM   CC:

## 2014-10-08 NOTE — Anesthesia Procedure Notes (Signed)
Procedure Name: Intubation Date/Time: 10/08/2014 11:47 AM Performed by: Lajuana Carry E Pre-anesthesia Checklist: Patient identified, Emergency Drugs available, Suction available and Patient being monitored Patient Re-evaluated:Patient Re-evaluated prior to inductionOxygen Delivery Method: Circle System Utilized Preoxygenation: Pre-oxygenation with 100% oxygen Intubation Type: IV induction Ventilation: Mask ventilation without difficulty Laryngoscope Size: Miller and 2 Grade View: Grade I Tube type: Oral Number of attempts: 1 Airway Equipment and Method: Stylet and Oral airway Placement Confirmation: ETT inserted through vocal cords under direct vision,  positive ETCO2 and breath sounds checked- equal and bilateral Secured at: 22 cm Tube secured with: Tape Dental Injury: Teeth and Oropharynx as per pre-operative assessment

## 2014-10-08 NOTE — Anesthesia Postprocedure Evaluation (Signed)
  Anesthesia Post-op Note  Patient: Carrie [name]  Procedure(s) Performed: Procedure(s) (LRB): ESOPHAGEAL ENDOSCOPIC ULTRASOUND (EUS) RADIAL (N/A)  Patient Location: PACU  Anesthesia Type: General  Level of Consciousness: awake and alert   Airway and Oxygen Therapy: Patient Spontanous Breathing  Post-op Pain: mild  Post-op Assessment: Post-op Vital signs reviewed, Patient's Cardiovascular Status Stable, Respiratory Function Stable, Patent Airway and No signs of Nausea or vomiting  Last Vitals:  Filed Vitals:   10/08/14 1300  BP: 119/41  Pulse: 69  Temp:   Resp: 14    Post-op Vital Signs: stable   Complications: No apparent anesthesia complications

## 2014-10-08 NOTE — H&P (Signed)
[facility] History and Physical  Carrie KYLLO Carrie [name]:[id number] DOB: Dec 07, 1928 DOA: 10/08/2014  Referring physician:  Dr. Paulita Fujita PCP:  Raelene Bott, MD   Chief Complaint:  Jaundice  HPI:  The patient is a 81 y.o. year-old female with history of Parkinson's disease, wheelchair dependent with some memory problems who was referred from Dr. Paulita Fujita today after EGD.  The patient was last at their baseline health about 3 months ago when she developed pneumonia.  She never fully recovered from her pneumonia and has been weaker than usual.  Over the last two weeks, she has had progressive fatigue, jaundice, abdominal pain, and nausea.  She also developed bruising and was seen at [facility] ER about a week ago with an INR of 10.  She was given vitamin K and her INR normalized.  Her liver function tests demonstrated obstructive jaundice.  MRCP showed a severely dilated common hepatic duct and she underwent ERCP today.  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  ERCP was aborted and she has been referred for percutaneous biliary drain by IR.  She has had intermittent epigastric and RUQ pains not worse with eating although she has had some early satiety.     Review of Systems:  General:  Denies fevers, chills, weight loss or gain HEENT:  Denies changes to hearing and vision, rhinorrhea, sinus congestion, sore throat CV:  Denies chest pain and palpitations, chronic lower extremity edema.  PULM:  Denies SOB, wheezing, cough.   GI:  + nausea, vomiting, constipation, denies diarrhea.   GU:  Denies dysuria, frequency, urgency ENDO:  Denies polyuria, polydipsia.   HEME:  Denies hematemesis, blood in stools, melena, + abnormal bruising but no obvious bleeding.  LYMPH:  Denies lymphadenopathy.   MSK:  Chronic arthralgias, myalgias.   DERM:  Denies skin rash or ulcer.   NEURO:  Denies focal numbness, weakness, slurred  speech, confusion, facial droop.  PSYCH:  Denies anxiety and depression.    Past Medical History  Diagnosis Date  . Low back pain   . Neuropathy   . Endometrial cancer   . Hiatal hernia   . Nephritis     polynephritis right sided  . Paralysis agitans 05/21/2013  . Abnormality of gait 05/21/2013  . Obesity   . Memory deficit    Past Surgical History  Procedure Laterality Date  . Foot surgery      5 th digit rt toe amputation/sarcoma,left hammer toe   . L5 radiculopathy      status post surgery Dr. Patrice Paradise 2006  . Abdominal hysterectomy      Re: endometrial cancer   Social History:  reports that she has never smoked. She has never used smokeless tobacco. She reports that she does not drink alcohol or use illicit drugs. WC dependent.  From SNF   Allergies  Allergen Reactions  . Sulfa Antibiotics Rash    Family History  Problem Relation Age of Onset  . Cancer Father   . Cancer Sister     glioblastoma  . Cancer Brother     throat cancer  . Parkinsonism Neg Hx   . Cancer Sister     glioblastoma  . Cancer Brother   . Cancer Brother      Prior to Admission medications   Medication Sig Start Date End Date Taking? Authorizing Provider  acetaminophen (TYLENOL) 650 MG CR tablet Take 650 mg by mouth 2 (two) times  daily.   Yes Historical Provider, MD  calcium-vitamin D (OSCAL WITH D) 500-200 MG-UNIT per tablet Take 1 tablet by mouth 2 (two) times daily.   Yes Historical Provider, MD  carbidopa-levodopa (SINEMET IR) 25-250 MG per tablet Take 1 tablet by mouth 4 (four) times daily - after meals and at bedtime. 1 @@ 0900, 1300, 1700, 2100   Yes Historical Provider, MD  carboxymethylcellulose (REFRESH PLUS) 0.5 % SOLN Place 2 drops into both eyes 3 (three) times daily.   Yes Historical Provider, MD  CefTRIAXone Sodium (ROCEPHIN IJ) Inject 1 g as directed daily. X 10 days.   Yes Historical Provider, MD  Multiple Vitamin (MULTIVITAMIN) tablet Take 1 tablet by mouth daily.   Yes  Historical Provider, MD  potassium chloride (MICRO-K) 10 MEQ CR capsule Take 10 mEq by mouth every morning.   Yes Historical Provider, MD  pramipexole (MIRAPEX) 0.125 MG tablet Take 1 tablet (0.125 mg total) by mouth 3 (three) times daily. Patient taking differently: Take 0.125 mg by mouth 3 (three) times daily. @ 0900, 1300, 2100 11/19/13  Yes Kathrynn Ducking, MD  ranitidine (ZANTAC) 150 MG tablet Take 150 mg by mouth daily. 05/19/13  Yes Historical Provider, MD  Vitamin D, Ergocalciferol, (DRISDOL) 50000 UNITS CAPS capsule Take 50,000 Units by mouth every 30 (thirty) days.   Yes Historical Provider, MD  docusate sodium (COLACE) 100 MG capsule Take 100 mg by mouth 2 (two) times daily.    Historical Provider, MD   Physical Exam: Filed Vitals:   10/08/14 1240 10/08/14 1250 10/08/14 1300 10/08/14 1310  BP: 117/52 136/41 119/41 136/45  Pulse: 69 67 69 73  Temp:      TempSrc:      Resp: 22 13 14 16   SpO2: 94% 92% 93% 93%     General:  Jaundiced female, NAD, slow to respond  Eyes:  PERRL, icteric, non-injected.  ENT:  Nares clear.  OP clear, non-erythematous without plaques or exudates.  MMM.    Neck:  Supple without TM or JVD.    Lymph:  No cervical, supraclavicular, or submandibular LAD.  Cardiovascular:  RRR, normal S1, S2, without m/r/g.  2+ pulses, warm extremities  Respiratory:  CTA bilaterally without increased WOB.  Abdomen:  NABS.  Soft, ND, mild TTP in the LUQ and LLQ   Skin:  Telangiectasias on cheeks, no rashes or focal lesions.  Musculoskeletal:  Decreased bulk and tone, left foot is contracted.  No LE edema.  Psychiatric:  A & O x 4.  Appropriate affect.  Neurologic:  CN 3-12 intact.  4/5 strength upper extremities, 2/5 lower extremities.  Sensation intact.  Labs on Admission:  Basic Metabolic Panel: No results for input(s): NA, K, CL, CO2, GLUCOSE, BUN, CREATININE, CALCIUM, MG, PHOS in the last 168 hours. Liver Function Tests: No results for input(s): AST,  ALT, ALKPHOS, BILITOT, PROT, ALBUMIN in the last 168 hours. No results for input(s): LIPASE, AMYLASE in the last 168 hours. No results for input(s): AMMONIA in the last 168 hours. CBC: No results for input(s): WBC, NEUTROABS, HGB, HCT, MCV, PLT in the last 168 hours. Cardiac Enzymes: No results for input(s): CKTOTAL, CKMB, CKMBINDEX, TROPONINI in the last 168 hours.  BNP (last 3 results) No results for input(s): BNP in the last 8760 hours.  ProBNP (last 3 results) No results for input(s): PROBNP in the last 8760 hours.  CBG: No results for input(s): GLUCAP in the last 168 hours.  Radiological Exams on Admission: No results found.  EKG: pending  Assessment/Plan Active Problems:   Obstructive jaundice due to cancer  ---  Obstructive jaundice secondary to cancer -  CA-19-9 -  CEA -  Percutaneous biliary drain to be placed by IR tomorrow -  Palliative care consult  -  zofran and oxycodone prn pain -  Avoid tylenol -  Trend CBC and LFTs  Parkinson's, stable -  Continue sinemet and mirapex -  Continue zoloft  Constipation -  Start daily lactulose -  Add prn miralax and bisacodyl  Dehydration -  Start IVF -  Labs are pending  Diet:  Regular, NPO at MN Access:  PIV IVF:  yes Proph:  SCD  Code Status:  DNR Family Communication: patient and her daughter Disposition Plan: Admit to med-surg  Time spent: 60 min Janece Canterbury [facility] Pager [phone]  If 7PM-7AM, please contact night-coverage [email or web address] Password [email or web address] 10/08/2014, 2:05 PM

## 2014-10-08 NOTE — Anesthesia Preprocedure Evaluation (Addendum)
Anesthesia Evaluation  Patient identified by MRN, date of birth, ID band Patient awake    Reviewed: Allergy & Precautions, H&P , NPO status , Patient's Chart, lab work & pertinent test results  Airway Mallampati: II  TM Distance: >3 FB Neck ROM: full    Dental no notable dental hx. (+) Teeth Intact, Dental Advisory Given   Pulmonary neg pulmonary ROS,  breath sounds clear to auscultation  Pulmonary exam normal       Cardiovascular Exercise Tolerance: Poor negative cardio ROS  Rhythm:regular Rate:Normal     Neuro/Psych Memory problems. Parkinson's disease negative psych ROS   GI/Hepatic negative GI ROS, Neg liver ROS, hiatal hernia, Medicated and Controlled,  Endo/Other  negative endocrine ROS  Renal/GU negative Renal ROS  negative genitourinary   Musculoskeletal   Abdominal   Peds  Hematology negative hematology ROS (+)   Anesthesia Other Findings   Reproductive/Obstetrics negative OB ROS                           Anesthesia Physical Anesthesia Plan  ASA: III  Anesthesia Plan: General   Post-op Pain Management:    Induction: Intravenous  Airway Management Planned: Oral ETT  Additional Equipment:   Intra-op Plan:   Post-operative Plan: Extubation in OR  Informed Consent: I have reviewed the patients History and Physical, chart, labs and discussed the procedure including the risks, benefits and alternatives for the proposed anesthesia with the patient or authorized representative who has indicated his/her understanding and acceptance.   Dental Advisory Given  Plan Discussed with: CRNA and Surgeon  Anesthesia Plan Comments:         Anesthesia Quick Evaluation

## 2014-10-09 ENCOUNTER — Inpatient Hospital Stay (HOSPITAL_COMMUNITY): Payer: Medicare Other

## 2014-10-09 ENCOUNTER — Encounter (HOSPITAL_COMMUNITY): Payer: Self-pay | Admitting: Gastroenterology

## 2014-10-09 DIAGNOSIS — Z515 Encounter for palliative care: Secondary | ICD-10-CM

## 2014-10-09 DIAGNOSIS — I950 Idiopathic hypotension: Secondary | ICD-10-CM

## 2014-10-09 DIAGNOSIS — G20 Parkinson's disease: Secondary | ICD-10-CM

## 2014-10-09 DIAGNOSIS — I959 Hypotension, unspecified: Secondary | ICD-10-CM

## 2014-10-09 DIAGNOSIS — E876 Hypokalemia: Secondary | ICD-10-CM

## 2014-10-09 DIAGNOSIS — R0989 Other specified symptoms and signs involving the circulatory and respiratory systems: Secondary | ICD-10-CM

## 2014-10-09 LAB — COMPREHENSIVE METABOLIC PANEL
ALT: 19 U/L (ref 0–35)
ALT: 16 U/L (ref 0–35)
AST: 131 U/L — ABNORMAL HIGH (ref 0–37)
AST: 115 U/L — ABNORMAL HIGH (ref 0–37)
Albumin: 2 g/dL — ABNORMAL LOW (ref 3.5–5.2)
Albumin: 1.8 g/dL — ABNORMAL LOW (ref 3.5–5.2)
Alkaline Phosphatase: 337 U/L — ABNORMAL HIGH (ref 39–117)
Alkaline Phosphatase: 334 U/L — ABNORMAL HIGH (ref 39–117)
Anion gap: 9 (ref 5–15)
Anion gap: 6 (ref 5–15)
BILIRUBIN TOTAL: 23.6 mg/dL — AB (ref 0.3–1.2)
BUN: 17 mg/dL (ref 6–23)
BUN: 14 mg/dL (ref 6–23)
CHLORIDE: 98 mmol/L (ref 96–112)
CHLORIDE: 103 mmol/L (ref 96–112)
CO2: 24 mmol/L (ref 19–32)
CO2: 24 mmol/L (ref 19–32)
CREATININE: 0.36 mg/dL — AB (ref 0.50–1.10)
CREATININE: 0.38 mg/dL — AB (ref 0.50–1.10)
Calcium: 8.4 mg/dL (ref 8.4–10.5)
Calcium: 7.9 mg/dL — ABNORMAL LOW (ref 8.4–10.5)
GFR calc Af Amer: >90 mL/min (ref >90)
GFR calc non Af Amer: >90 mL/min (ref >90)
GLUCOSE: 132 mg/dL — AB (ref 70–99)
Glucose, Bld: 106 mg/dL — ABNORMAL HIGH (ref 70–99)
Potassium: 2.9 mmol/L — ABNORMAL LOW (ref 3.5–5.1)
Potassium: 3.1 mmol/L — ABNORMAL LOW (ref 3.5–5.1)
SODIUM: 131 mmol/L — AB (ref 135–145)
Sodium: 133 mmol/L — ABNORMAL LOW (ref 135–145)
Total Bilirubin: 22 mg/dL (ref 0.3–1.2)
Total Protein: 5.3 g/dL — ABNORMAL LOW (ref 6.0–8.3)
Total Protein: 5.1 g/dL — ABNORMAL LOW (ref 6.0–8.3)

## 2014-10-09 LAB — CBC
HCT: 32.5 % — ABNORMAL LOW (ref 36.0–46.0)
Hemoglobin: 11.1 g/dL — ABNORMAL LOW (ref 12.0–15.0)
MCH: 31.4 pg (ref 26.0–34.0)
MCHC: 34.2 g/dL (ref 30.0–36.0)
MCV: 91.8 fL (ref 78.0–100.0)
Platelets: 126 10*3/uL — ABNORMAL LOW (ref 150–400)
RBC: 3.54 MIL/uL — ABNORMAL LOW (ref 3.87–5.11)
RDW: 18.3 % — ABNORMAL HIGH (ref 11.5–15.5)
WBC: 4.9 10*3/uL (ref 4.0–10.5)

## 2014-10-09 LAB — HEMOGLOBIN AND HEMATOCRIT, BLOOD
HEMATOCRIT: 32.9 % — AB (ref 36.0–46.0)
HEMATOCRIT: 32.7 % — AB (ref 36.0–46.0)
Hemoglobin: 11.4 g/dL — ABNORMAL LOW (ref 12.0–15.0)
Hemoglobin: 11.2 g/dL — ABNORMAL LOW (ref 12.0–15.0)

## 2014-10-09 LAB — LACTIC ACID, PLASMA: Lactic Acid, Venous: 1.6 mmol/L (ref 0.5–2.0)

## 2014-10-09 LAB — TROPONIN I: TROPONIN I: 0.03 ng/mL (ref <0.031)

## 2014-10-09 LAB — TYPE AND SCREEN
ABO/RH(D): A POS
Antibody Screen: NEGATIVE

## 2014-10-09 LAB — PROTIME-INR
INR: 0.99 (ref 0.00–1.49)
PROTHROMBIN TIME: 13.2 s (ref 11.6–15.2)

## 2014-10-09 MED ORDER — DIAZEPAM 2 MG PO TABS: 2.0000 mg | ORAL_TABLET | Freq: Four times a day (QID) | ORAL | Status: DC | PRN

## 2014-10-09 MED ORDER — HYDROMORPHONE HCL 1 MG/ML IJ SOLN
0.2500 mg | INTRAMUSCULAR | Status: DC | PRN
  Administered 2014-10-11 – 2014-10-12 (×3): 0.25 mg via INTRAVENOUS
  Filled 2014-10-09 (×3): qty 1

## 2014-10-09 MED ORDER — PIPERACILLIN-TAZOBACTAM 3.375 G IVPB
3.3750 g | Freq: Three times a day (TID) | INTRAVENOUS | Status: DC
  Administered 2014-10-09 – 2014-10-11 (×6): 3.375 g via INTRAVENOUS
  Filled 2014-10-09 (×7): qty 50

## 2014-10-09 MED ORDER — POTASSIUM CHLORIDE CRYS ER 20 MEQ PO TBCR
40.0000 meq | EXTENDED_RELEASE_TABLET | Freq: Two times a day (BID) | ORAL | Status: AC
  Administered 2014-10-09 – 2014-10-10 (×3): 40 meq via ORAL
  Filled 2014-10-09 (×3): qty 2

## 2014-10-09 MED ORDER — FENTANYL CITRATE 0.05 MG/ML IJ SOLN
INTRAMUSCULAR | Status: AC
  Filled 2014-10-09: qty 4

## 2014-10-09 MED ORDER — LIDOCAINE HCL 1 % IJ SOLN
INTRAMUSCULAR | Status: AC
  Filled 2014-10-09: qty 20

## 2014-10-09 MED ORDER — SODIUM CHLORIDE 0.9 % IV BOLUS (SEPSIS)
1000.0000 mL | Freq: Once | INTRAVENOUS | Status: AC
  Administered 2014-10-09: 1000 mL via INTRAVENOUS

## 2014-10-09 MED ORDER — MIDAZOLAM HCL 2 MG/2ML IJ SOLN
INTRAMUSCULAR | Status: AC
  Filled 2014-10-09: qty 6

## 2014-10-09 MED ORDER — HYDROMORPHONE HCL 2 MG PO TABS
2.0000 mg | ORAL_TABLET | ORAL | Status: DC | PRN
  Administered 2014-10-11 (×2): 2 mg via ORAL
  Filled 2014-10-09 (×3): qty 1

## 2014-10-09 MED ORDER — FENTANYL CITRATE 0.05 MG/ML IJ SOLN
INTRAMUSCULAR | Status: AC | PRN
  Administered 2014-10-09: 25 ug via INTRAVENOUS

## 2014-10-09 MED ORDER — IOHEXOL 300 MG/ML  SOLN
10.0000 mL | Freq: Once | INTRAMUSCULAR | Status: AC | PRN
  Administered 2014-10-09: 10 mL

## 2014-10-09 MED ORDER — MIDAZOLAM HCL 2 MG/2ML IJ SOLN
INTRAMUSCULAR | Status: AC | PRN
  Administered 2014-10-09: 0.5 mg via INTRAVENOUS

## 2014-10-09 NOTE — Progress Notes (Signed)
[facility] PROGRESS NOTE  Carrie [name] DOB: 08-06-1928 DOA: 10/08/2014 PCP: Raelene Bott, MD  Brief Summary  The patient is a 81 y.o. year-old female with history of Parkinson's disease, wheelchair dependent with some memory problems who was referred from Dr. Paulita Fujita today after EGD. The patient was at their baseline health about 3 months ago when she developed pneumonia. She never regained her strength. Over the two weeks prior to presentation, she developed progressive fatigue, jaundice, abdominal pain, and nausea. She also developed bruising and was seen at [facility] ER with an INR of 10. She was given vitamin K and her INR normalized. Her liver function tests demonstrated obstructive jaundice. MRCP showed a severely dilated common hepatic duct and she underwent ERCP on 3/9. 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 ERCP was aborted and she has been referred for percutaneous biliary drain by IR. She has had intermittent epigastric and RUQ pains not worse with eating although she has had some early satiety. Underwent percutaneous biliary drain placement on 3/10 and post-procedure she has been persistently hypotensive to the 42V and 95G systolic.  H&H has remained stable.    Assessment/Plan  Hypotension post procedure.  Afebrile and on zosyn.  Ddx includes post-operative sedation, but only received a small amount of midazolam and fentanyl over 3 hours.  Concerned for bleeding complication either due to procedure or spontaneous GI from necrotic tumor.   -  Transfer to stepdown unit -  1L IVF:  Already has rales -  CXR -  Cycle H&H -  Type and screen -  Lactic acid -  ECG, troponins -  Telemetry  -  Repeat electrolytes -  Patient would be amenable to trying bipap -  Family would like to hold off on central line and vasopressors in the interest of preserving some comfort  -   Not stable for transfer to CT scan at this time.  Obstructive jaundice secondary to cancer - CA-19-9 - CEA - Percutaneous biliary drain placed today by IR - Palliative care consult  - zofran and oxycodone prn pain - Avoid tylenol - Trend CBC and LFTs  Parkinson's, stable - Continue sinemet and mirapex - Continue zoloft  Constipation - Start daily lactulose - Add prn miralax and bisacodyl  Dehydration - Start IVF - Labs are pending  Diet: Regular, NPO at MN Access: PIV IVF: yes Proph: SCD  Code Status: DNR Family Communication: patient and her daughter Disposition Plan: Admit to med-surg   Consultants:  IR, Dr. Kathlene Cote  GI, Dr. Paulita Fujita  Procedures:  EGD on 3/9  Percutaneous biliary drain placed on 3/10  Antibiotics:  Zosyn 3/10 >  HPI/Subjective:  Confused.  Having pain in right upper quadrant/chest region, denies SOB, nausea  Objective: Filed Vitals:   10/09/14 1212 10/09/14 1218 10/09/14 1220 10/09/14 1336  BP: 130/70 123/70 109/55 85/33  Pulse: 60 59 62 58  Temp:    97.6 F (36.4 C)  TempSrc:    Oral  Resp: 15 13 16 16   Height:      Weight:      SpO2: 95% 96% 97% 96%    Intake/Output Summary (Last 24 hours) at 10/09/14 1347 Last data filed at 10/09/14 0931  Gross per 24 hour  Intake   1470 ml  Output      0 ml  Net   1470 ml   Filed Weights   10/08/14 1934  Weight: 90.719 kg (200 lb)    Exam:   General:  Jaundiced F, mild respiratory distress with SCM retractions, supraclavicular RTX  HEENT:  NCAT, MMM  Cardiovascular:  RRR, nl S1, S2 no mrg, 2+ pulses, warm extremities  Respiratory:  Rales at bilateral bases, no rhonchi or wheeze  Abdomen:   Hyperactive BS, soft, TTP in RUQ without rebound or guarding.  No signs of peritonitis  MSK:   Decreased tone and bulk, no LEE  Neuro:  4/5 strength upper extremities, 2/5 lower extremities. Sensation intact  Data Reviewed: Basic Metabolic Panel:  Recent  Labs Lab 10/08/14 1532 10/09/14 0553  NA 132* 131*  K 3.3* 2.9*  CL 97 98  CO2 26 24  GLUCOSE 109* 106*  BUN 19 17  CREATININE 0.41* 0.36*  CALCIUM 8.6 8.4  MG 2.3  --    Liver Function Tests:  Recent Labs Lab 10/08/14 1532 10/09/14 0553  AST 140* 131*  ALT 30 19  ALKPHOS 382* 337*  BILITOT 25.6* 23.6*  PROT 5.8* 5.3*  ALBUMIN 2.2* 2.0*   No results for input(s): LIPASE, AMYLASE in the last 168 hours. No results for input(s): AMMONIA in the last 168 hours. CBC:  Recent Labs Lab 10/08/14 1532 10/09/14 0553  WBC 6.5 4.9  HGB 12.6 11.1*  HCT 36.8 32.5*  MCV 92.0 91.8  PLT 153 126*   Cardiac Enzymes: No results for input(s): CKTOTAL, CKMB, CKMBINDEX, TROPONINI in the last 168 hours. BNP (last 3 results) No results for input(s): BNP in the last 8760 hours.  ProBNP (last 3 results) No results for input(s): PROBNP in the last 8760 hours.  CBG: No results for input(s): GLUCAP in the last 168 hours.  No results found for this or any previous visit (from the past 240 hour(s)).   Studies: No results found.  Scheduled Meds: . carbidopa-levodopa  1 tablet Oral TID PC & HS  . docusate sodium  100 mg Oral BID  . famotidine  20 mg Oral Daily  . lactulose  30 g Oral Daily  . lidocaine      . multivitamin with minerals  1 tablet Oral Daily  . piperacillin-tazobactam (ZOSYN)  IV  3.375 g Intravenous Once  . piperacillin-tazobactam (ZOSYN)  IV  3.375 g Intravenous Q8H  . polyvinyl alcohol  2 drop Both Eyes TID  . potassium chloride  40 mEq Oral BID  . pramipexole  0.125 mg Oral TID  . sertraline  50 mg Oral Daily  . sodium chloride  1,000 mL Intravenous Once   Continuous Infusions: . dextrose 5 % and 0.45% NaCl 100 mL/hr at 10/09/14 0033    Active Problems:   Obstructive jaundice due to cancer    Time spent: 30 min    Rika Daughdrill, [facility] Pager [phone]. If 7PM-7AM, please contact night-coverage at [email or web address], password  [facility] 10/09/2014, 1:47 PM  LOS: 1 day

## 2014-10-09 NOTE — Care Management Note (Signed)
    Page 1 of 1   10/09/2014     11:09:25 AM CARE MANAGEMENT NOTE 10/09/2014  Patient:  Carrie [name], Carrie [name]   Account Number:  [id number]  Date Initiated:  10/09/2014  Documentation initiated by:  Sunday Spillers  Subjective/Objective Assessment:   81 yo female admitted with obstructive jaundice, failed ERCP.     Action/Plan:   Plan for perc drain, return to SNF when stable   Anticipated DC Date:  10/11/2014   Anticipated DC Plan:  SKILLED NURSING FACILITY  In-house referral  Clinical Social Worker      DC Planning Services  CM consult      Choice offered to / List presented to:             Status of service:  Completed, signed off Medicare Important Message given?   (If response is "NO", the following Medicare IM given date fields will be blank) Date Medicare IM given:   Medicare IM given by:   Date Additional Medicare IM given:   Additional Medicare IM given by:    Discharge Disposition:  [place]  Per UR Regulation:  Reviewed for med. necessity/level of care/duration of stay  If discussed at [place] of Stay Meetings, dates discussed:    Comments:

## 2014-10-09 NOTE — Progress Notes (Signed)
Clinical Social Work Department BRIEF PSYCHOSOCIAL ASSESSMENT 10/09/2014  Patient:  Carrie [name], Carrie [name]     Account Number:  [id number]     Admit date:  10/08/2014  Clinical Social Worker:  Lacie Scotts  Date/Time:  10/09/2014 01:40 PM  Referred by:  Physician  Date Referred:  10/09/2014 Referred for  SNF Placement   Other Referral:   Interview type:  Patient Other interview type:    PSYCHOSOCIAL DATA Living Status:  FACILITY Admitted from facility:  Other Level of care:  [place] Primary support name:  Rachell Cipro Primary support relationship to patient:  CHILD, ADULT Degree of support available:   supportive    CURRENT CONCERNS Current Concerns  Post-Acute Placement   Other Concerns:    SOCIAL WORK ASSESSMENT / PLAN Pt is an 81 yr old female admitted from [facility] . CSW met with pt / daughter to assist with d/c planning. Pt plans to return to SNF following hospital d/c. CSW has contacted SNF and d/c plans have been confirmed. Clinicals have been sent to facility. CSW will continue to follow to assist with d/c planning back to SNF when stable.   Assessment/plan status:  Psychosocial Support/Ongoing Assessment of Needs Other assessment/ plan:   Information/referral to community resources:   Transportation back to facility discussed with pt / daughter. Facility's w/c is in pt's room.  If possible, pt will return to SNF via w/c Lucianne Lei from facility. This will not be possible if d/c is over the weekend ( no SNF transport service over the weekend ). If medically required csw will assist with [facility] transport.    PATIENT'S/FAMILY'S RESPONSE TO PLAN OF CARE: " I'm not feeling that good. " Pt reports that she would like to go back to SNF when she is feeling better. Daughter supports this plan. Pt appears uncomfortable. " I hope I feel better soon. " Support provided.   Werner Lean LCSW [phone]

## 2014-10-09 NOTE — Procedures (Signed)
Procedure:  PTC and external biliary drainage Findings:  High grade obstruction of the mid/distal CBD with significant ballooning of duct above obstruction.  Could not cross obstruction with guidewires. 10 Fr pigtail external biliary drain placed and positioned in CBD.  Bile dark brown and turbid.  Samples sent for culture and cytology.  Complications:  None EBL < 25 mL Will follow tube output and bilirubin  Teairra Millar T. Kathlene Cote, M.D. Pager:  [phone]

## 2014-10-09 NOTE — Consult Note (Signed)
Palliative Medicine Team at [facility]  Date: 10/09/2014   Patient Name: Carrie [name]  DOB: [birth year]  MRN: [id number]  Age / Sex: 81 y.o., female   PCP: Raelene Bott, MD Referring Physician: Janece Canterbury, MD  Active Problems: Active Problems:   Obstructive jaundice due to cancer   Hypotension   Hypokalemia   Parkinson's disease   Rales   HPI/Reason for Consultation: Carrie [name] is a 81 y.o. female with advanced parkinson's disease, recently discharged from [facility] hospital with PNA and discharged to SNF who was admitted to [facility] on 3/9 with increased edema, fatigue, and acute onset jaundice. ERCP showed a large necrotic mass in the head of the pancreas with bile duct obstruction-stent could not be placed so IR placed a perc biliary drain to relieve obstruction. Post procedure she had issues with severe hypotension and was confused-palliative consulted for goals of care and discussion about whether or not to escalate level of care given very poor prognosis with pancreatic mass.  Participants in Discussion: HCPOA: yes Daughter Carrie [name] is a OR nurse here at [facility] Directive: yes   Code Status Orders        Start     Ordered   10/09/14 1559  Do not attempt resuscitation (DNR)   Continuous    Question Answer Comment  In the event of cardiac or respiratory ARREST Do not call a "code blue"   In the event of cardiac or respiratory ARREST Do not perform Intubation, CPR, defibrillation or ACLS   In the event of cardiac or respiratory ARREST Use medication by any route, position, wound care, and other measures to relive pain and suffering. May use oxygen, suction and manual treatment of airway obstruction as needed for comfort.      10/09/14 1559    Advance Directive Documentation        Most Recent Value   Type of Advance Directive  Healthcare Power of Attorney   Pre-existing out of facility DNR order (yellow form or pink MOST form)     "MOST" Form in Place?        I have  reviewed the medical record, interviewed the patient and family, and examined the patient. The following aspects are pertinent.  Past Medical History  Diagnosis Date  . Low back pain   . Neuropathy   . Endometrial cancer   . Hiatal hernia   . Nephritis     polynephritis right sided  . Paralysis agitans 05/21/2013  . Abnormality of gait 05/21/2013  . Obesity   . Memory deficit   . 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 Parkinson's disease    History   Social History  . Marital Status: Widowed    Spouse Name: N/A  . Number of Children: 2  . Years of Education: hs   Occupational History  . retired    Social History Main Topics  . Smoking status: Never Smoker   . Smokeless tobacco: Never Used  . Alcohol Use: No  . Drug Use: No  . Sexual Activity: No   Other Topics Concern  . None   Social History Narrative   Family History  Problem Relation Age of Onset  . Cancer Father     stomach  . Cancer Sister     glioblastoma  . Cancer Brother     throat cancer  . Parkinsonism Neg Hx   . Cancer Sister     glioblastoma  . Cancer Brother  pancreatic  . Cancer Brother    Scheduled Meds: . carbidopa-levodopa  1 tablet Oral TID PC & HS  . famotidine  20 mg Oral Daily  . lidocaine      . piperacillin-tazobactam (ZOSYN)  IV  3.375 g Intravenous Q8H  . polyvinyl alcohol  2 drop Both Eyes TID  . potassium chloride  40 mEq Oral BID  . pramipexole  0.125 mg Oral TID  . sertraline  50 mg Oral Daily   Continuous Infusions:  PRN Meds:.alum & mag hydroxide-simeth, bisacodyl, diazepam, HYDROmorphone (DILAUDID) injection, HYDROmorphone, ondansetron **OR** ondansetron (ZOFRAN) IV Allergies  Allergen Reactions  . Sulfa Antibiotics Rash   CBC:    Component Value Date/Time   WBC 4.9 10/09/2014 0553   HGB 11.2* 10/09/2014 1620   HCT 32.7* 10/09/2014 1620   PLT 126* 10/09/2014 0553   MCV 91.8 10/09/2014 0553   NEUTROABS 3.9 04/16/2007 1415   LYMPHSABS 1.3 04/16/2007 1415    MONOABS 0.4 04/16/2007 1415   EOSABS 0.1 04/16/2007 1415   BASOSABS 0.0 04/16/2007 1415   Comprehensive Metabolic Panel:    Component Value Date/Time   NA 133* 10/09/2014 1620   K 3.1* 10/09/2014 1620   CL 103 10/09/2014 1620   CO2 24 10/09/2014 1620   BUN 14 10/09/2014 1620   CREATININE 0.38* 10/09/2014 1620   GLUCOSE 132* 10/09/2014 1620   CALCIUM 7.9* 10/09/2014 1620   AST 115* 10/09/2014 1620   ALT 16 10/09/2014 1620   ALKPHOS 334* 10/09/2014 1620   BILITOT 22.0* 10/09/2014 1620   PROT 5.1* 10/09/2014 1620   ALBUMIN 1.8* 10/09/2014 1620    Vital Signs: BP 101/63 mmHg  Pulse 65  Temp(Src) 97.5 F (36.4 C) (Axillary)  Resp 16  Ht 5\' 4"  (1.626 m)  Wt 90.719 kg (200 lb)  BMI 34.31 kg/m2  SpO2 99% Filed Weights   10/08/14 1934  Weight: 90.719 kg (200 lb)   03/09 0701 - 03/10 0700 In: 2170 [I.V.:2170] Out: 0   Physical Exam:  +jaundice, lethargic, +parkinsons rigidity, alert, able to have reasonable discussion, ++edema  Summary of Established Goals of Care and Medical Treatment Preferences  Primary Diagnoses  1. Pancreatic Cancer Presenting as Obstructive Jaundice 2. Parkinson's disease 3. Debility/Failure to Thrive  Active Symptoms: 1. Abdominal Pain 2. Drain insertion site pain 3. Tremor/Rigidity 4. Nausea 5. Poor appetite- now improved 6. 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 Code Status: DNR 2. Scope of Treatment:  No invasive procedures including labs  Comfort Care Approach-focus on QOL  Pain and symptom management important to patient and family  Manage obstructive jaundice for comfort- she reports feeling better with drain placement despite pain at site of insertion  Med-surg level of care NO ICU/or SDU  Ok to continue antibiotics for another 24 hours post  procedure  Discontinued non-essential oral medications-give others for as long as she is able to swallow pills- especially her parkinson's meds  3. Symptom Management:  1. Started IV prn hydromorphone low dose and oral hydrocodone prn 2. Diazepam PRN for sleep and anxiety 4. Palliative Prophylaxis: PRN bowel regimen- she has been having very loose stools 5. Disposition:  I discussed [facility] as a very appropriate discharge plan given the findings today with large mass of pancreas and obstruction- [facility] or [place] had a very open and frank  conversation with the patient and with Carrie [name] her daughter- this is clearly very terminal and I encouraged them to think about how Carrie [name] would want to spend the time she has left - I offered comfort, dignity and a plan for pain and symptom control that would not require transfer to SDU- they were very much in agreement. Some family are struggling with concepts of comfort-but in general the patient and Carrie [name] her daughter understand that she is dying and that our current interventions should have palliative intent only.  I will meet them tomorrow at 9AM-other family to be in as well.  Time In: 5:30 Time Out: 6:30 Time Total: 60 minutes Greater than 50%  of this time was spent counseling and coordinating care related to the above assessment and plan.  Signed by: Roma Schanz, DO  10/09/2014, 6:14 PM  Please contact Palliative Medicine Team phone at [phone] for questions and concerns.

## 2014-10-10 DIAGNOSIS — I9581 Postprocedural hypotension: Secondary | ICD-10-CM

## 2014-10-10 DIAGNOSIS — Z789 Other specified health status: Secondary | ICD-10-CM

## 2014-10-10 DIAGNOSIS — K831 Obstruction of bile duct: Secondary | ICD-10-CM | POA: Insufficient documentation

## 2014-10-10 LAB — CANCER ANTIGEN 19-9: CA 19-9: 96060 U/mL — ABNORMAL HIGH (ref 0–35)

## 2014-10-10 LAB — ABO/RH: ABO/RH(D): A POS

## 2014-10-10 MED ORDER — POLYETHYLENE GLYCOL 3350 17 G PO PACK: 17.0000 g | PACK | Freq: Every day | ORAL | Status: DC | PRN

## 2014-10-10 NOTE — Clinical Documentation Improvement (Signed)
Supporting Information: Hypotension post procedure. Afebrile and on zosyn. Ddx includes post-operative sedation, but only received a small amount of midazolam and fentanyl over 3 hours. Concerned for bleeding complication either due to procedure or spontaneous GI from necrotic tumor.  - Transfer to stepdown unit  - 1L IVF: Already has rales per 3/10 progress notes.    Possible Clinical Conditions? Septic Shock Cardiogenic Shock Hypovolemic Shock Hemorrhagic Shock Hyper / Hypoglycemic Shock Neurogenic Shock Anaphylactic Shock Other Condition Cannot Clinically determine     Thank You, Jeannetta Ellis ,RN Clinical Documentation Specialist:  [phone]  [place] Information Management

## 2014-10-10 NOTE — Clinical Documentation Improvement (Signed)
Abnormal Lab and/or Testing Results: Sodium: 3/10: 131. 3/09: 132.   Treatment provided: 3/09:  D5 1/2 NaCl @ 100 ml/hr.    Possible Clinical Conditions? >  Hyponatremia >  Other >  Cannot clinically determine     Thank Sherian Maroon Documentation Specialist [phone] [email or web address]

## 2014-10-10 NOTE — Progress Notes (Signed)
[facility] PROGRESS NOTE  Blaine S Aikey MRN:[id number] DOB: 06/24/1929 DOA: 10/08/2014 PCP: HOFFMAN,BYRON, MD  Brief Summary  The patient is a 81 y.o. year-old female with history of Parkinson's disease, wheelchair dependent with some memory problems who was referred from Dr. Outlaw today after EGD. The patient was at their baseline health about 3 months ago when she developed pneumonia. She never regained her strength. Over the two weeks prior to presentation, she developed progressive fatigue, jaundice, abdominal pain, and nausea. She also developed bruising and was seen at [place] [facility] with an INR of 10. She was given vitamin K and her INR normalized. Her liver function tests demonstrated obstructive jaundice. MRCP showed a severely dilated common hepatic duct and she underwent ERCP on 3/9. 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 ERCP was aborted and she has been referred for percutaneous biliary drain by IR. She has had intermittent epigastric and RUQ pains not worse with eating although she has had some early satiety. Underwent percutaneous biliary drain placement on 3/10 and post-procedure she was hypotensive to the 70s and 80s systolic.  H&H has remained stable.  She and her family met with palliative care.  GOC is currently comfort.  Family is deciding between hospice facility vs. SNF.  Anticipate she will be ready for discharge in next 1-2 days.    Assessment/Plan  Hypotension post procedure.  May have had a vagal response post-procedure or side effect from sedation, resolved, but blood pressures still low -  CXR:  Bilateral interstitial prominence -  No further labs - comfort measures only - Palliative care consult   Obstructive jaundice secondary to cancer - [id number] - Percutaneous biliary drain placed 3/10 by IR, jaundice decreasing -  Drainage appears clear -  Bile  cultures:  NGTD -  Continue zosyn pending culture results - zofran and dilaudid prn - Avoid tylenol  Parkinson's, stable - Continue sinemet and mirapex - Continue zoloft  Constipation, resolved - Continue prn miralax and bisacodyl  Hyponatremia likely secondary to SSRI use, mild and asymptomatic  Diet: Regular Access: PIV IVF: off Proph: SCD  Code Status: DNR Family Communication: patient and her daughter Disposition Plan:  Awaiting possible transfer to hospice facility or SNF in next 1-2 days depending on recovery from procedure, tolerating diet  Consultants:  IR, Dr. Yamagata  GI, Dr. Outlaw  Procedures:  EGD on 3/9  Percutaneous biliary drain placed on 3/10  Antibiotics:  Zosyn 3/10 >  HPI/Subjective:  Having pain in right upper quadrant/chest region, denies SOB, nausea, tolerating meals.    Objective: Filed Vitals:   10/09/14 2200 10/10/14 0200 10/10/14 0600 10/10/14 1351  BP: 118/56 118/63 101/50 92/61  Pulse: 66 58 57 57  Temp: 97.6 F (36.4 C) 97.9 F (36.6 C) 97.4 F (36.3 C) 97.7 F (36.5 C)  TempSrc: Oral Oral Oral Oral  Resp: 18 18 18 20  Height:      Weight:      SpO2: 97% 97% 98% 98%    Intake/Output Summary (Last 24 hours) at 10/10/14 1623 Last data filed at 10/10/14 1400  Gross per 24 hour  Intake    510 ml  Output    825 ml  Net   -315 ml   Filed Weights   10/08/14 1934  Weight: 90.719 kg (200 lb)    Exam:   General:  Jaundiced F, NAD  HEENT:  NCAT, MMM    Cardiovascular:  RRR, nl S1, S2 no mrg, 2+ pulses, warm extremities  Respiratory:  Few Rales at deep base, markedly improved, no rhonchi or wheeze  Abdomen:   NABS, soft, TTP in RUQ without rebound or guarding.  No signs of peritonitis.  Drain in place with clear-brown fluid  MSK:   Decreased tone and bulk, no LEE  Data Reviewed: Basic Metabolic Panel:  Recent Labs Lab 10/08/14 1532 10/09/14 0553 10/09/14 1620  NA 132* 131* 133*  K 3.3* 2.9* 3.1*   CL 97 98 103  CO2 _0 GLUCOSE 109* 106* 132*  BUN _1 CREATININE 0.41* 0.36* 0.38*  CALCIUM 8.6 8.4 7.9*  MG 2.3  --   --    Liver Function Tests:  Recent Labs Lab 10/08/14 1532 10/09/14 0553 10/09/14 1620  AST 140* 131* 115*  ALT _2 ALKPHOS 382* 337* 334*  BILITOT 25.6* 23.6* 22.0*  PROT 5.8* 5.3* 5.1*  ALBUMIN 2.2* 2.0* 1.8*   No results for input(s): LIPASE, AMYLASE in the last 168 hours. No results for input(s): AMMONIA in the last 168 hours. CBC:  Recent Labs Lab 10/08/14 1532 10/09/14 0553 10/09/14 1430 10/09/14 1620  WBC 6.5 4.9  --   --   HGB 12.6 11.1* 11.4* 11.2*  HCT 36.8 32.5* 32.9* 32.7*  MCV 92.0 91.8  --   --   PLT 153 126*  --   --    Cardiac Enzymes:  Recent Labs Lab 10/09/14 1620  TROPONINI 0.03   BNP (last 3 results) No results for input(s): BNP in the last 8760 hours.  ProBNP (last 3 results) No results for input(s): PROBNP in the last 8760 hours.  CBG: No results for input(s): GLUCAP in the last 168 hours.  Recent Results (from the past 240 hour(s))  Culture, routine-abscess     Status: None (Preliminary result)   Collection Time: 10/09/14 12:50 PM  Result Value Ref Range Status   Specimen Description ABSCESS COMMON BIE DUCT  Final   Special Requests Normal  Final   Gram Stain   Final    NO WBC SEEN NO SQUAMOUS EPITHELIAL CELLS SEEN NO ORGANISMS SEEN Performed at [facility]    Culture PENDING  Incomplete   Report Status PENDING  Incomplete  Anaerobic culture     Status: None (Preliminary result)   Collection Time: 10/09/14 12:50 PM  Result Value Ref Range Status   Specimen Description ABSCESS COMMON BILE DUCT  Final   Special Requests Normal  Final   Gram Stain   Final    NO WBC SEEN NO SQUAMOUS EPITHELIAL CELLS SEEN NO ORGANISMS SEEN Performed at [facility]    Culture   Final    NO ANAEROBES ISOLATED; CULTURE IN PROGRESS FOR 5 DAYS Performed at [facility]     Report Status PENDING  Incomplete     Studies: Dg Chest Port 1 View  10/09/2014   CLINICAL DATA:  with history of Parkinson's disease, wheelchair dependent with some memory problems who was referred from Dr. Paulita Fujita today after EGD. The patient was at their baseline health about 3 months ago when she developed pneumonia. She never regained her strength. Over the two weeks prior to presentation, she developed progressive fatigue, jaundice, abdominal pain, and nausea. She also developed bruising and was seen at [facility] ER with an INR of 10. She was given vitamin K and her INR normalized. Her liver function tests demonstrated obstructive jaundice. underwent  ERCP on 3/9. 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 ERCP was aborted and she has been referred for percutaneous biliary drain by IR. She has had intermittent epigastric and RUQ pains not worse with eating although she has had some early satiety. Underwent percutaneous biliary drain placement on 3/10 and post-procedure she has been persistently hypotensive to the 70s and 80s systolic  EXAM: PORTABLE CHEST - 1 VIEW  COMPARISON:  06/14/2007  FINDINGS: Low lung volumes. Linear scarring or atelectasis in the mid lung zones as before. Mild central interstitial prominence bilaterally may be secondary to atelectasis although I can't entirely exclude mild interstitial edema or infiltrate. Heart size upper limits normal for technique. No effusion. Thoracolumbar spinal fixation hardware is partially seen. Percutaneous drain projects over the right upper abdomen. Degenerative changes in bilateral shoulders.  IMPRESSION: 1. Bilateral interstitial prominence may be secondary to low lung volumes versus early interstitial edema or infiltrate. 2. Postop and degenerative changes as above.   Electronically Signed   By: D  Hassell M.D.   On: 10/09/2014 17:17   Ir Biliary Drain Placement  With Cholangiogram  10/09/2014   INDICATION: High-grade biliary obstruction with endoscopic ultrasound demonstrating ill-defined necrotic mass in the region of the porta hepatis without normal-appearing extrahepatic bile ducts. The patient's bilirubin is 24.  EXAM: 1. PERCUTANEOUS TRANSHEPATIC CHOLANGIOGRAM UNDER FLUOROSCOPY WITH ULTRASOUND GUIDANCE FOR RIGHT LOBE BILE DUCT ACCESS 2. PLACEMENT OF NEW EXTERNAL PERCUTANEOUS BILIARY DRAINAGE CATHETER  COMPARISON:  None.  MEDICATIONS: A scheduled dose of 3.375 g IV Zosyn was infusing during the procedure.  CONTRAST:  30 mL Omnipaque 300  ANESTHESIA/SEDATION: 0.5 mg IV Versed, 25 mcg IV fentanyl.  Total Moderate Sedation Time  24 minutes  FLUOROSCOPY TIME:  7 minutes and 54 seconds.  COMPLICATIONS: None immediate  TECHNIQUE: Informed written consent was obtained from the patient's daughter after a discussion of the risks, benefits and alternatives to treatment. Questions regarding the procedure were encouraged and answered. A timeout was performed prior to the initiation of the procedure.  The right upper abdominal quadrant was prepped and draped in the usual sterile fashion, and a sterile drape was applied covering the operative field. Maximum barrier sterile technique with sterile gowns and gloves were used for the procedure. A timeout was performed prior to the initiation of the procedure.  Ultrasound scanning of the right upper abdominal quadrant was performed to delineate the anatomy and avoid transgression of the gallbladder or the pleural space.  After the overlying soft tissues were anesthetized with 1% Lidocaine, a 21 gauge needle was advanced into a right lobe bile duct under direct ultrasound guidance. Ultrasound image documentation was performed. Contrast was injected through the needle after return of bile. Cholangiogram spot images were obtained under fluoroscopy.  A transitional dilator was further advanced into central hepatic ducts and contrast  injection performed. A 5 French catheter was then advanced into the common bile duct over a guidewire and additional cholangiogram thick images obtained with contrast injection.  Various guidewires were utilized in attempt to traverse a common bile duct obstruction. Ultimately, a 10 French external drainage catheter was advanced over a guidewire and formed. Bile return was dark brown and turbid in nature. Bile samples were sent for culture analysis and cytology. This was injected with contrast material to confirm position. The catheter was then flushed and connected to a gravity drainage bag. The catheter was secured at the skin with   a Prolene retention suture and StatLock device.  FINDINGS: Cholangiography was performed after needle access of a peripheral right lobe hepatic duct. Contrast injection demonstrates severe and diffuse intrahepatic biliary ductal dilatation. Both right-sided and left-sided hepatic ducts are massively dilated.  More central injection shows a dilated proper hepatic and common hepatic bile duct with significant focal dilatation noted at roughly the level of the proximal to mid common bile duct. The cystic duct is not visualized. High-grade obstruction was present with inability to pass a guidewire through the level of obstruction into the duodenum.  IMPRESSION: Massive dilatation of intrahepatic ducts and proximal extrahepatic bile ducts with significant dilatation of the proximal to mid common bile duct with complete obstruction present. The obstruction could not be crossed with a guidewire. A 10 French external biliary drainage catheter was placed and is draining bile. Bile samples were sent for culture and cytologic analysis.   Electronically Signed   By: Glenn  Yamagata M.D.   On: 10/09/2014 15:44    Scheduled Meds: . carbidopa-levodopa  1 tablet Oral TID PC & HS  . famotidine  20 mg Oral Daily  . piperacillin-tazobactam (ZOSYN)  IV  3.375 g Intravenous Q8H  . polyvinyl alcohol   2 drop Both Eyes TID  . pramipexole  0.125 mg Oral TID  . sertraline  50 mg Oral Daily   Continuous Infusions:    Active Problems:   Obstructive jaundice due to cancer   Hypotension   Hypokalemia   Parkinson's disease   Rales   Obstructive jaundice    Time spent: 30 min    SHORT, MACKENZIE  [facility] Pager [phone]. If 7PM-7AM, please contact night-coverage at [email or web address], password [email or web address] 10/10/2014, 4:23 PM  LOS: 2 days              

## 2014-10-10 NOTE — Progress Notes (Signed)
Patient ID: Carrie [name], female   DOB: Nov 08, 1928, 81 y.o.   MRN: [id number]    Referring Physician(s): Dr. Paulita Fujita  Subjective:  Pt feeling a little better today per daughter; appears slightly less jaundiced; some soreness at RUQ drain site; denies N/V  Allergies: Sulfa antibiotics  Medications: Prior to Admission medications   Medication Sig Start Date End Date Taking? Authorizing Provider  acetaminophen (TYLENOL) 650 MG CR tablet Take 650 mg by mouth 2 (two) times daily.   Yes Historical Provider, MD  calcium-vitamin D (OSCAL WITH D) 500-200 MG-UNIT per tablet Take 1 tablet by mouth 2 (two) times daily.   Yes Historical Provider, MD  carbidopa-levodopa (SINEMET IR) 25-250 MG per tablet Take 1 tablet by mouth 4 (four) times daily - after meals and at bedtime. 1 @@ 0900, 1300, 1700, 2100   Yes Historical Provider, MD  carboxymethylcellulose (REFRESH PLUS) 0.5 % SOLN Place 2 drops into both eyes 3 (three) times daily.   Yes Historical Provider, MD  CefTRIAXone Sodium (ROCEPHIN IJ) Inject 1 g as directed daily. X 10 days.   Yes Historical Provider, MD  Multiple Vitamin (MULTIVITAMIN) tablet Take 1 tablet by mouth daily.   Yes Historical Provider, MD  potassium chloride (MICRO-K) 10 MEQ CR capsule Take 10 mEq by mouth every morning.   Yes Historical Provider, MD  pramipexole (MIRAPEX) 0.125 MG tablet Take 1 tablet (0.125 mg total) by mouth 3 (three) times daily. Patient taking differently: Take 0.125 mg by mouth 3 (three) times daily. @ 0900, 1300, 2100 11/19/13  Yes Kathrynn Ducking, MD  ranitidine (ZANTAC) 150 MG tablet Take 150 mg by mouth daily. 05/19/13  Yes Historical Provider, MD  Vitamin D, Ergocalciferol, (DRISDOL) 50000 UNITS CAPS capsule Take 50,000 Units by mouth every 30 (thirty) days.   Yes Historical Provider, MD  docusate sodium (COLACE) 100 MG capsule Take 100 mg by mouth 2 (two) times daily.    Historical Provider, MD     Vital Signs: BP 92/61 mmHg  Pulse 57  Temp(Src)  97.7 F (36.5 C) (Oral)  Resp 20  Ht 5' 4"  (1.626 m)  Wt 200 lb (90.719 kg)  BMI 34.31 kg/m2  SpO2 98%  Physical Exam RUQ drain intact, mildly tender, output 275 cc's; cx's/cyt pending; drain irrigated with 10 cc's sterile NS without difficulty  Imaging: Dg Chest Port 1 View  10/09/2014   CLINICAL DATA:  with history of Parkinson's disease, wheelchair dependent with some memory problems who was referred from Dr. Paulita Fujita today after EGD. The patient was at their baseline health about 3 months ago when she developed pneumonia. She never regained her strength. Over the two weeks prior to presentation, she developed progressive fatigue, jaundice, abdominal pain, and nausea. She also developed bruising and was seen at [facility] ER with an INR of 10. She was given vitamin K and her INR normalized. Her liver function tests demonstrated obstructive jaundice. underwent ERCP on 3/9. 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 ERCP was aborted and she has been referred for percutaneous biliary drain by IR. She has had intermittent epigastric and RUQ pains not worse with eating although she has had some early satiety. Underwent percutaneous biliary drain placement on 3/10 and post-procedure she has been persistently hypotensive to the 62B and 76E systolic  EXAM: PORTABLE CHEST - 1 VIEW  COMPARISON:  06/14/2007  FINDINGS: Low lung volumes. Linear scarring or atelectasis in  the mid lung zones as before. Mild central interstitial prominence bilaterally may be secondary to atelectasis although I can't entirely exclude mild interstitial edema or infiltrate. Heart size upper limits normal for technique. No effusion. Thoracolumbar spinal fixation hardware is partially seen. Percutaneous drain projects over the right upper abdomen. Degenerative changes in bilateral shoulders.  IMPRESSION: 1. Bilateral interstitial prominence may be  secondary to low lung volumes versus early interstitial edema or infiltrate. 2. Postop and degenerative changes as above.   Electronically Signed   By: Lucrezia Europe M.D.   On: 10/09/2014 17:17   Ir Biliary Drain Placement With Cholangiogram  10/09/2014   INDICATION: High-grade biliary obstruction with endoscopic ultrasound demonstrating ill-defined necrotic mass in the region of the porta hepatis without normal-appearing extrahepatic bile ducts. The patient's bilirubin is 24.  EXAM: 1. PERCUTANEOUS TRANSHEPATIC CHOLANGIOGRAM UNDER FLUOROSCOPY WITH ULTRASOUND GUIDANCE FOR RIGHT LOBE BILE DUCT ACCESS 2. PLACEMENT OF NEW EXTERNAL PERCUTANEOUS BILIARY DRAINAGE CATHETER  COMPARISON:  None.  MEDICATIONS: A scheduled dose of 3.375 g IV Zosyn was infusing during the procedure.  CONTRAST:  30 mL Omnipaque 300  ANESTHESIA/SEDATION: 0.5 mg IV Versed, 25 mcg IV fentanyl.  Total Moderate Sedation Time  24 minutes  FLUOROSCOPY TIME:  7 minutes and 54 seconds.  COMPLICATIONS: None immediate  TECHNIQUE: Informed written consent was obtained from the patient's daughter after a discussion of the risks, benefits and alternatives to treatment. Questions regarding the procedure were encouraged and answered. A timeout was performed prior to the initiation of the procedure.  The right upper abdominal quadrant was prepped and draped in the usual sterile fashion, and a sterile drape was applied covering the operative field. Maximum barrier sterile technique with sterile gowns and gloves were used for the procedure. A timeout was performed prior to the initiation of the procedure.  Ultrasound scanning of the right upper abdominal quadrant was performed to delineate the anatomy and avoid transgression of the gallbladder or the pleural space.  After the overlying soft tissues were anesthetized with 1% Lidocaine, a 21 gauge needle was advanced into a right lobe bile duct under direct ultrasound guidance. Ultrasound image documentation was  performed. Contrast was injected through the needle after return of bile. Cholangiogram spot images were obtained under fluoroscopy.  A transitional dilator was further advanced into central hepatic ducts and contrast injection performed. A 5 French catheter was then advanced into the common bile duct over a guidewire and additional cholangiogram thick images obtained with contrast injection.  Various guidewires were utilized in attempt to traverse a common bile duct obstruction. Ultimately, a 48 [place] external drainage catheter was advanced over a guidewire and formed. Bile return was dark brown and turbid in nature. Bile samples were sent for culture analysis and cytology. This was injected with contrast material to confirm position. The catheter was then flushed and connected to a gravity drainage bag. The catheter was secured at the skin with a Prolene retention suture and StatLock device.  FINDINGS: Cholangiography was performed after needle access of a peripheral right lobe hepatic duct. Contrast injection demonstrates severe and diffuse intrahepatic biliary ductal dilatation. Both right-sided and left-sided hepatic ducts are massively dilated.  More central injection shows a dilated proper hepatic and common hepatic bile duct with significant focal dilatation noted at roughly the level of the proximal to mid common bile duct. The cystic duct is not visualized. High-grade obstruction was present with inability to pass a guidewire through the level of obstruction into the duodenum.  IMPRESSION: Massive  dilatation of intrahepatic ducts and proximal extrahepatic bile ducts with significant dilatation of the proximal to mid common bile duct with complete obstruction present. The obstruction could not be crossed with a guidewire. A 10 [place] external biliary drainage catheter was placed and is draining bile. Bile samples were sent for culture and cytologic analysis.   Electronically Signed   By: Aletta Edouard  M.D.   On: 10/09/2014 15:44    Labs:  CBC:  Recent Labs  10/08/14 1532 10/09/14 0553 10/09/14 1430 10/09/14 1620  WBC 6.5 4.9  --   --   HGB 12.6 11.1* 11.4* 11.2*  HCT 36.8 32.5* 32.9* 32.7*  PLT 153 126*  --   --     COAGS:  Recent Labs  10/08/14 1532 10/09/14 0553  INR 0.93 0.99    BMP:  Recent Labs  10/08/14 1532 10/09/14 0553 10/09/14 1620  NA 132* 131* 133*  K 3.3* 2.9* 3.1*  CL 97 98 103  CO2 26 24 24   GLUCOSE 109* 106* 132*  BUN 19 17 14   CALCIUM 8.6 8.4 7.9*  CREATININE 0.41* 0.36* 0.38*  GFRNONAA >90 >90 >90  GFRAA >90 >90 >90    LIVER FUNCTION TESTS:  Recent Labs  10/08/14 1532 10/09/14 0553 10/09/14 1620  BILITOT 25.6* 23.6* 22.0*  AST 140* 131* 115*  ALT 30 19 16   ALKPHOS 382* 337* 334*  PROT 5.8* 5.3* 5.1*  ALBUMIN 2.2* 2.0* 1.8*    Assessment and Plan: Obstructive jaundice, necrotic porta hepatis mass, high grade obst of mid/distal CBD; S/P PTC with ext drain 3/10; pt on comfort care measures per palliative care with no further invasive procedures/labs; rec cont irrigation of drain with sterile NS, output monitoring and dressing changes prn; f/u bile cx's/cytology.   Signed: Autumn Messing 10/10/2014, 2:10 PM   I spent a total of 15 minutes  in face to face in clinical consultation/evaluation, greater than 50% of which was counseling/coordinating care for biliary drain

## 2014-10-10 NOTE — Progress Notes (Signed)
CSW met with pt / daughter to assist with d/c planning. Pt / daughter are considering options : residential hospice vs returning to SNF with Hospice / Palliative services. Clinicals sent to SNF for review. SNF reports they are able to manage pt's care. SNF suggested pt return with palliative care  initially so NP can continue providing care. Pt can request change from Palliative to Hospice care at facility at anytime. Pt / daughter are considering this option as well as residential placement at either [facility] or the [facility], depending on availability at time of d/c. CSW will continue to follow to assist with d/c planning needs.  Werner Lean LCSW [phone]

## 2014-10-10 NOTE — Progress Notes (Signed)
Met with patient and her daughter this AM- overall Ms. Gethers seems much better. C/o soreness around her bili tube site but reports she is now able to eat without nausea. She is debilitated and WC bound at baseline.Goals remain comfort and palliative intervention/treatment approach. Daughter Katharine Look is at bedside- additional family to be in this afternoon. Patient wants to go back to [facility]- she really needs [place] level care for her pain and symptom management-[place] would be the closest facility. I initiated a discussion on this but she wants to consider all options- she came from [facility] and felt well cared for by there NP from [facility] and the staff there.  Continue to monitor bili drain, IV abx, pain control.  Will follow.  Lane Hacker, DO Palliative Medicine [phone]

## 2014-10-11 MED ORDER — ONDANSETRON HCL 4 MG/2ML IJ SOLN
4.0000 mg | Freq: Three times a day (TID) | INTRAMUSCULAR | Status: DC
  Administered 2014-10-11 – 2014-10-12 (×3): 4 mg via INTRAVENOUS
  Filled 2014-10-11 (×2): qty 2

## 2014-10-11 MED ORDER — PROMETHAZINE HCL 25 MG/ML IJ SOLN: 6.2500 mg | INTRAMUSCULAR | Status: DC | PRN

## 2014-10-11 MED ORDER — HYDROMORPHONE HCL 1 MG/ML IJ SOLN
0.5000 mg | Freq: Once | INTRAMUSCULAR | Status: AC
Start: 2014-10-11 — End: 2014-10-11
  Administered 2014-10-11: 0.5 mg via INTRAVENOUS
  Filled 2014-10-11: qty 1

## 2014-10-11 MED ORDER — FAMOTIDINE IN NACL 20-0.9 MG/50ML-% IV SOLN
20.0000 mg | INTRAVENOUS | Status: DC
  Filled 2014-10-11 (×2): qty 50

## 2014-10-11 MED ORDER — DIAZEPAM 5 MG/ML IJ SOLN: 2.5000 mg | INTRAMUSCULAR | Status: DC | PRN

## 2014-10-11 MED ORDER — DEXAMETHASONE SODIUM PHOSPHATE 4 MG/ML IJ SOLN
2.0000 mg | Freq: Once | INTRAMUSCULAR | Status: AC
  Administered 2014-10-11: 2 mg via INTRAVENOUS
  Filled 2014-10-11: qty 0.5

## 2014-10-11 NOTE — Progress Notes (Signed)
[facility] PROGRESS NOTE  Carrie [name] MGQ:[id number] DOB: 12-Jun-1929 DOA: 10/08/2014 PCP: Raelene Bott, MD  Brief Summary  The patient is a 81 y.o. year-old female with history of Parkinson's disease, wheelchair dependent with some memory problems who was referred from Dr. Paulita Fujita today after EGD. The patient was at their baseline health about 3 months ago when she developed pneumonia. She never regained her strength. Over the two weeks prior to presentation, she developed progressive fatigue, jaundice, abdominal pain, and nausea. She also developed bruising and was seen at [facility] ER with an INR of 10. She was given vitamin K and her INR normalized. Her liver function tests demonstrated obstructive jaundice. MRCP showed a severely dilated common hepatic duct and she underwent ERCP on 3/9. 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 ERCP was aborted and she has been referred for percutaneous biliary drain by IR. She has had intermittent epigastric and RUQ pains not worse with eating although she has had some early satiety. Underwent percutaneous biliary drain placement on 3/10 and post-procedure she was hypotensive to the 26Z and 12W systolic.  H&H has remained stable.  She and her family met with palliative care.  GOC is currently comfort.  Transfer to [facility] in AM  Assessment/Plan  Obstructive jaundice secondary to cancer, rapid decline, worsening jaundice  - [id number] - Percutaneous biliary drain placed 3/10 by IR, jaundice decreasing -  Drainage appears clear -  Bile cultures:  NGTD -  D/c zosyn - zofran and dilaudid prn - Avoid tylenol  Hypotension post procedure.  May have had a vagal response post-procedure or side effect from sedation, resolved, but blood pressures still low -  CXR:  Bilateral interstitial prominence -  No further labs - comfort measures only -  Appreciate Palliative care assistance  Parkinson's, stable - Continue sinemet and mirapex - Continue zoloft  Constipation, resolved - Continue prn miralax and bisacodyl  Hyponatremia likely secondary to SSRI use, mild and asymptomatic  Diet: Regular Access: PIV IVF: off Proph: SCD  Code Status: DNR Family Communication: patient and her daughter Disposition Plan:  Transfer to [facility] in AM  Consultants:  IR, Dr. Kathlene Cote  GI, Dr. Paulita Fujita  Procedures:  EGD on 3/9  Percutaneous biliary drain placed on 3/10  Antibiotics:  Zosyn 3/10 >  HPI/Subjective:  Very weak and tired today.  Not feeling good due to nausea.    Objective: Filed Vitals:   10/10/14 1351 10/10/14 2138 10/11/14 0601 10/11/14 1400  BP: 92/61 93/49 107/89 152/92  Pulse: 57 63 69 70  Temp: 97.7 F (36.5 C) 98 F (36.7 C) 98 F (36.7 C) 97.9 F (36.6 C)  TempSrc: Oral Oral Oral Oral  Resp: _0 Height:      Weight:      SpO2: 98% 98% 97% 94%    Intake/Output Summary (Last 24 hours) at 10/11/14 1838 Last data filed at 10/11/14 1800  Gross per 24 hour  Intake    340 ml  Output    850 ml  Net   -510 ml   Filed Weights   10/08/14 1934  Weight: 90.719 kg (200 lb)    Exam:   General:  Jaundiced F, NAD  HEENT:  NCAT, MMM  Cardiovascular:  RRR, nl S1, S2 no mrg, 2+ pulses, warm extremities  Respiratory:  rhonchorous bilateral breath sounds, no focal rales, no wheeze  Abdomen:  NABS, soft, TTP in RUQ without rebound or guarding.  Drain in place with clear-brown fluid  MSK:   Decreased tone and bulk, no LEE  Data Reviewed: Basic Metabolic Panel:  Recent Labs Lab 10/08/14 1532 10/09/14 0553 10/09/14 1620  NA 132* 131* 133*  K 3.3* 2.9* 3.1*  CL 97 98 103  CO2 26 24 24  GLUCOSE 109* 106* 132*  BUN 19 17 14  CREATININE 0.41* 0.36* 0.38*  CALCIUM 8.6 8.4 7.9*  MG 2.3  --   --    Liver Function Tests:  Recent Labs Lab 10/08/14 1532 10/09/14 0553  10/09/14 1620  AST 140* 131* 115*  ALT 30 19 16  ALKPHOS 382* 337* 334*  BILITOT 25.6* 23.6* 22.0*  PROT 5.8* 5.3* 5.1*  ALBUMIN 2.2* 2.0* 1.8*   No results for input(s): LIPASE, AMYLASE in the last 168 hours. No results for input(s): AMMONIA in the last 168 hours. CBC:  Recent Labs Lab 10/08/14 1532 10/09/14 0553 10/09/14 1430 10/09/14 1620  WBC 6.5 4.9  --   --   HGB 12.6 11.1* 11.4* 11.2*  HCT 36.8 32.5* 32.9* 32.7*  MCV 92.0 91.8  --   --   PLT 153 126*  --   --    Cardiac Enzymes:  Recent Labs Lab 10/09/14 1620  TROPONINI 0.03   BNP (last 3 results) No results for input(s): BNP in the last 8760 hours.  ProBNP (last 3 results) No results for input(s): PROBNP in the last 8760 hours.  CBG: No results for input(s): GLUCAP in the last 168 hours.  Recent Results (from the past 240 hour(s))  Culture, routine-abscess     Status: None (Preliminary result)   Collection Time: 10/09/14 12:50 PM  Result Value Ref Range Status   Specimen Description ABSCESS COMMON BIE DUCT  Final   Special Requests Normal  Final   Gram Stain   Final    NO WBC SEEN NO SQUAMOUS EPITHELIAL CELLS SEEN NO ORGANISMS SEEN Performed at [facility]    Culture   Final    NO GROWTH 1 DAY Performed at [facility]    Report Status PENDING  Incomplete  Anaerobic culture     Status: None (Preliminary result)   Collection Time: 10/09/14 12:50 PM  Result Value Ref Range Status   Specimen Description ABSCESS COMMON BILE DUCT  Final   Special Requests Normal  Final   Gram Stain   Final    NO WBC SEEN NO SQUAMOUS EPITHELIAL CELLS SEEN NO ORGANISMS SEEN Performed at [facility]    Culture   Final    NO ANAEROBES ISOLATED; CULTURE IN PROGRESS FOR 5 DAYS Performed at [facility]    Report Status PENDING  Incomplete     Studies: No results found.  Scheduled Meds: . carbidopa-levodopa  1 tablet Oral TID PC & HS  . famotidine (PEPCID) IV  20 mg  Intravenous Q24H  . ondansetron (ZOFRAN) IV  4 mg Intravenous 3 times per day  . piperacillin-tazobactam (ZOSYN)  IV  3.375 g Intravenous Q8H  . polyvinyl alcohol  2 drop Both Eyes TID  . pramipexole  0.125 mg Oral TID  . sertraline  50 mg Oral Daily   Continuous Infusions:    Active Problems:   Obstructive jaundice due to cancer   Hypotension   Hypokalemia   Parkinson's disease   Rales   Obstructive jaundice    Time spent: 30 min    SHORT, MACKENZIE    [facility] Pager [phone]. If 7PM-7AM, please contact night-coverage at [email or web address], password [email or web address] 10/11/2014, 6:38 PM  LOS: 3 days              

## 2014-10-11 NOTE — Progress Notes (Signed)
Palliative Medicine Team Progress Note  Carrie [name] has taken a fairly dramatic decline this morning. Increased pain, nausea- located in her "chest" epigastric area. Carrie [name] is at bedside and is very aware of her mother's change in status. Goals are comfort. It is difficult to determine non-verbal signs of pain due to her parkinson's disease and Carrie [name] is very stoic- she told me yesterday that she was one of 8 children born in [place], [place] and is from a strong [employer]. She is the last of her siblings-she is aware of the dying process from being with her family through similar experiences- 3 of her brothers had pancreatic cancer, and two of her sisters had glioblastoma. Carrie [name] is very attentive. We discussed how to best control her pain today- she needs very frequent pain assessments- she will not volunteer pain or call out and she naturally grimaces from her parkinson's rigidity.   Filed Vitals:   10/11/14 0601  BP: 107/89  Pulse: 69  Temp: 98 F (36.7 C)  Resp: 18   Pain around bili tube site. Abdomen is more distended.  Weaker and less talkative. 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 Prognosis is hours-days at this point and original estimate was <2 weeks. She is most appropriate for a Hospice Faciliy and I do not recommend SNF given semi-private room and needs for very close monitoring for pain and NV.  Recommendations:  1. Give Dilaudid IV X1 now 2. Scheduled IV Zofran, PRN Phenergan for refractory nausea 3. She may need low dose continuous infusion started for her pain- I will assess her pain med usage this afternoon.  Carrie [name] requests [employer] for [employer]. Given her mothers decline this is going to best so that she can check on her frequently vs. Back to [facility].  Lane Hacker, DO Palliative Medicine [phone]

## 2014-10-11 NOTE — Clinical Social Work Note (Signed)
CSW received a call from Dr. Hilma Favors stating that the pt and family have requested hospice at [facility]  Dr. Hilma Favors stated pt is ready for discharge to facility and family realizes that a bed would need to be available  CSW reviewed pt chart and called Hospice/[facility]  CSW spoke with Levada Dy at [facility] to present pt's history and faxed pt information to [facility] stated they will review pt information consult with their MD's and call CSW back   CSW will follow up with pt and family  Dede Query, [place] Social Worker - Weekend Coverage cell #: [phone]

## 2014-10-11 NOTE — Clinical Social Work Note (Signed)
CSW received call from [facility] that pt is accepted into [facility] and they have a bed available for her this weekend  CSW went to pt's room but daughter, Ivin Booty was gone and RN was unaware if she would return today.  CSW has attempted to call pt's daughter Ivin Booty (only have a home phone) and it continues to ring busy  Hospice can meet with pt and family Sunday morning and arrange for transport to [facility] however CSW unable to refer this information to pt's daughter  CSW left sticky note in pt chart reflecting this information and will continue to try and reach pt's daughter to arrange for hospice meeting in a.m  .Dede Query, LCSW [facility] Clinical Social Worker - Weekend Coverage cell #: [phone]

## 2014-10-11 NOTE — Clinical Social Work Note (Signed)
CSW was able to speak with pt's daughter to set up meeting with hospice tomorrow (sunday) at [place] in pt's room  Pt should discharge to [facility] tomorrow  CSW will continue to follow up with pt's needs until discharge  .Dede Query, LCSW [facility] Clinical Social Worker - Weekend Coverage cell #: [phone]

## 2014-10-12 DIAGNOSIS — C259 Malignant neoplasm of pancreas, unspecified: Secondary | ICD-10-CM

## 2014-10-12 MED ORDER — ALUM & MAG HYDROXIDE-SIMETH 200-200-20 MG/5ML PO SUSP: 30.0000 mL | Freq: Four times a day (QID) | ORAL | Status: AC | PRN

## 2014-10-12 MED ORDER — PROMETHAZINE HCL 25 MG/ML IJ SOLN: 6.2500 mg | INTRAMUSCULAR | Status: AC | PRN

## 2014-10-12 MED ORDER — BISACODYL 5 MG PO TBEC: 5.0000 mg | DELAYED_RELEASE_TABLET | Freq: Every day | ORAL | Status: AC | PRN

## 2014-10-12 MED ORDER — DIAZEPAM 2 MG PO TABS: 2.0000 mg | ORAL_TABLET | Freq: Four times a day (QID) | ORAL | Status: AC | PRN

## 2014-10-12 MED ORDER — POLYETHYLENE GLYCOL 3350 17 G PO PACK: 17.0000 g | PACK | Freq: Every day | ORAL | Status: AC | PRN

## 2014-10-12 MED ORDER — HYDROMORPHONE HCL 2 MG PO TABS: 2.0000 mg | ORAL_TABLET | ORAL | Status: AC | PRN

## 2014-10-12 MED ORDER — SERTRALINE HCL 50 MG PO TABS: 50.0000 mg | ORAL_TABLET | Freq: Every day | ORAL | Status: AC

## 2014-10-12 MED ORDER — FAMOTIDINE IN NACL 20-0.9 MG/50ML-% IV SOLN: 20.0000 mg | INTRAVENOUS | Status: AC

## 2014-10-12 MED ORDER — HYDROMORPHONE HCL 1 MG/ML IJ SOLN: 0.2500 mg | INTRAMUSCULAR | Status: AC | PRN

## 2014-10-12 MED ORDER — DIAZEPAM 5 MG/ML IJ SOLN: 2.5000 mg | INTRAMUSCULAR | Status: AC | PRN

## 2014-10-12 MED ORDER — ONDANSETRON HCL 4 MG/2ML IJ SOLN: 4.0000 mg | Freq: Three times a day (TID) | INTRAMUSCULAR | Status: AC

## 2014-10-12 NOTE — Clinical Social Work Placement (Signed)
Clinical Social Work Department CLINICAL SOCIAL WORK PLACEMENT NOTE 10/12/2014  Patient:  Carrie [name], Carrie [name]  Account Number:  [id number] [place] date:  10/08/2014  Clinical Social Worker:  Dede Query, CLINICAL SOCIAL WORKER  Date/time:  10/12/2014 02:32 PM  Clinical Social Work is seeking post-discharge placement for this patient at the following level of care:   SKILLED NURSING   (*CSW will update this form in Epic as items are completed)     Patient/family provided with [place] Department of Clinical Social Work's list of facilities offering this level of care within the geographic area requested by the patient (or if unable, by the patient's family).    Patient/family informed of their freedom to choose among providers that offer the needed level of care, that participate in Medicare, Medicaid or managed care program needed by the patient, have an available bed and are willing to accept the patient.    Patient/family informed of [facility]' ownership interest in [facility], as well as of the fact that they are under no obligation to receive care at this facility.  PASARR submitted to EDS on  PASARR number received on   FL2 transmitted to all facilities in geographic area requested by pt/family on   FL2 transmitted to all facilities within larger geographic area on   Patient informed that his/her managed care company has contracts with or will negotiate with  certain facilities, including the following:     Patient/family informed of bed offers received:   Patient chooses bed at [facility] and patient chooses bed at    Patient to be transferred to [facility] on  10/12/2014 Patient to be transferred to facility by ambulance Patient and family notified of transfer on 10/12/2014 Name of family member notified:  Sandra/Daughter  The following physician request were entered in Epic:   Additional Comments:  .Dede Query, [place] Worker - Weekend Coverage cell #: [phone]

## 2014-10-12 NOTE — Clinical Social Work Note (Addendum)
CSW met with pt's daughter and [facility] representative to complete paperwork for [employer]   CSW provided active and supportive listening for pt's daughter with regards to pt's diagnoses and transfer to [facility] today  CSW sent MD a text letting her know that pt has a bed at [facility] and is ready to be discharged once a D/C summary and DNR are completed.  CSW will monitor and let pt's daughter know when pt will be transported to hospice[facility]  CSW received a message from pt's MD that paperwork was completed on pt  CSW met with p's daughter to let her know that pt's MD had completed all of the required paperwork and CSW would be arranging for transport to hospice [facility].  CSW prompted pt's daughter to discuss any needs she had with regards to pt or pt's family.  Pt's daughter appreciative of CSW help and support and acknowledged how how hard some of the conversations have been with pt the past couple of days talking about pt's diagnoses and the decision to transfer to hospice [facility] home.  Dede Query, LCSW [facility] Clinical Social Worker - Weekend Coverage cell #: [phone]

## 2014-10-12 NOTE — Plan of Care (Signed)
Problem: Phase I Progression Outcomes Goal: OOB as tolerated unless otherwise ordered Outcome: Completed/Met Date Met:  10/12/14 Pt on bedrest Goal: Hemodynamically stable Outcome: Completed/Met Date Met:  10/12/14 VS sufficient for d/c to hospice.  Problem: Phase II Progression Outcomes Goal: Progress activity as tolerated unless otherwise ordered Outcome: Completed/Met Date Met:  10/12/14 Pt on bedrest.  Problem: Phase III Progression Outcomes Goal: Pain controlled on oral analgesia Outcome: Completed/Met Date Met:  10/12/14 Pt taking IV pain meds (terminal cancer); [facility] will adapt meds as needed. Goal: Activity at appropriate level-compared to baseline (UP IN CHAIR FOR HEMODIALYSIS)  Outcome: Completed/Met Date Met:  10/12/14 Pt on bedrest for d/c. Goal: Voiding independently Outcome: Completed/Met Date Met:  10/12/14 Pt voiding; incontinent.  Problem: Discharge Progression Outcomes Goal: Complications resolved/controlled Outcome: Completed/Met Date Met:  10/12/14 Sufficient for discharge. Goal: Tolerating diet Outcome: Completed/Met Date Met:  10/12/14 Decreased appetite due to pancreatic cancer, but nausea controlled with scheduled meds. Goal: Activity appropriate for discharge plan Outcome: Completed/Met Date Met:  10/12/14 Pt on bedrest.

## 2014-10-12 NOTE — Discharge Summary (Signed)
Pt being d/c to [facility].  Report called to nurse Doreen Salvage.  Hospice nurse, Social Worker, and this RN have spoken with pt and family about plans.  Daughter in agreement with plan.  Pt calm, appearing to rest comfortably today, given intermittent pain meds.  Removed PIV and pt bathed by NT Sandy.  Pt now being discharged and transported to [facility] via transportation service.  Written d/c papers accompanying pt.

## 2014-10-12 NOTE — Discharge Summary (Addendum)
Physician Discharge Summary  Carrie [name] XYB:[id number] DOB: 13-May-1929 DOA: 10/08/2014  PCP: Raelene Bott, MD  Admit date: 10/08/2014 Discharge date: 10/12/2014  -  Transfer to [facility] for hospice care -  Flush drainage catheter with 65m BID, dressing changes as needed  Discharge Diagnoses:  Principal Problem:   Pancreatic cancer Active Problems:   Obstructive jaundice due to cancer   Hypotension   Hypokalemia   Parkinson's disease   Rales   Obstructive jaundice   Discharge Condition: fair  Diet recommendation: regular  Wt Readings from Last 3 Encounters:  10/08/14 90.719 kg (200 lb)    History of present illness:  The patient is a 81y.o. year-old female with history of Parkinson's disease, wheelchair dependent with some memory problems who was referred from Dr. OPaulita Fujitatoday after EGD. The patient was at their baseline health about 3 months ago when she developed pneumonia. She never regained her strength. Over the two weeks prior to presentation, she developed progressive fatigue, jaundice, abdominal pain, and nausea. She also developed [facility]ising [facility] She was given vitamin K and her INR normalized. Her liver function tests demonstrated obstructive jaundice. MRCP showed a severely dilated common hepatic duct and she underwent ERCP on 3/9. 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 ERCP was aborted [facility]. She underwent percutaneous biliary drain placement on 3/10 and post-procedure she was hypotensive to the 766Mand 860Osystolic. She recovered from her hypotension.  Her CA 19-9 was very elevated suggesting primary pancreato-biliary cancer and her prognosis is poor.  She has had intermittent epigastric and RUQ pains, nausea, poor appetite, and an overall decline since  admission.  She and her family met with palliative care. GOC is currently comfort. [facility]  Hospital Course:   Pancreatic cancer with obstructive jaundice secondary to cancer, rapid decline, ongoing nausea and pain - CA-19-9 [id number] - Percutaneous biliary drain placed 3/10 by IR - Drainage appears clear - Bile cultures: NGTD - was on zosyn peri-operatively, however, no further antibiotics at this time -  Goal is comfort - zofran and dilaudid prn  Hypotension post procedure. May have had a vagal response post-procedure or side effect from sedation, resolved - CXR: Bilateral interstitial prominence -  H&H remained stable - No further labs - comfort measures only - Appreciate Palliative care assistance   Parkinson's and depression, stable - Continue sinemet and mirapex - Continue zoloft  Constipation, resolved - Continue prn miralax and bisacodyl  Hyponatremia likely secondary to SSRI use, mild and asymptomatic -  No further labs  Consultants:  IR, Dr. YKathlene Cote GI, Dr. OPaulita Fujita Palliative care, Dr. GHilma Favors Procedures:  EGD on 3/9  Percutaneous biliary drain placed on 3/10  Antibiotics:  Zosyn 3/10 > 3/12  Discharge Exam: Filed Vitals:   10/12/14 0535  BP: 106/59  Pulse: 70  Temp: 98.6 F (37 C)  Resp: 18   Filed Vitals:   10/11/14 0601 10/11/14 1400 10/11/14 2110 10/12/14 0535  BP: 107/89 152/92 101/37 106/59  Pulse: 69 70 70 70  Temp: 98 F (36.7 C) 97.9 F (36.6 C) 98.6 F (37 C) 98.6 F (37 C)  TempSrc: Oral Oral Oral Oral  Resp: _0 Height:      Weight:  SpO2: 97% 94% 96% 100%     General: Jaundiced F, NAD, asking for coffee and OJ  HEENT: NCAT, MMM  Cardiovascular: RRR, nl S1, S2 no mrg, 2+ pulses, warm extremities  Respiratory: rales at bilateral bases, no wheezes, rhonchi, no increased WOB  Abdomen: NABS, soft, TTP in RUQ without rebound or  guarding. Drain in place with clear-brown fluid  MSK: Decreased lower extremity tone and bulk, no LEE  Discharge Instructions      Discharge Instructions    Call MD for:  difficulty breathing, headache or visual disturbances    Complete by:  As directed      Call MD for:  extreme fatigue    Complete by:  As directed      Call MD for:  hives    Complete by:  As directed      Call MD for:  persistant dizziness or light-headedness    Complete by:  As directed      Call MD for:  persistant nausea and vomiting    Complete by:  As directed      Call MD for:  severe uncontrolled pain    Complete by:  As directed      Call MD for:  temperature >100.4    Complete by:  As directed      Diet general    Complete by:  As directed      Increase activity slowly    Complete by:  As directed             Medication List    STOP taking these medications        acetaminophen 650 MG CR tablet  Commonly known as:  TYLENOL     calcium-vitamin D 500-200 MG-UNIT per tablet  Commonly known as:  OSCAL WITH D     docusate sodium 100 MG capsule  Commonly known as:  COLACE     multivitamin tablet     potassium chloride 10 MEQ CR capsule  Commonly known as:  MICRO-K     ranitidine 150 MG tablet  Commonly known as:  ZANTAC     ROCEPHIN IJ     Vitamin D (Ergocalciferol) 50000 UNITS Caps capsule  Commonly known as:  DRISDOL      TAKE these medications        alum & mag hydroxide-simeth 200-200-20 MG/5ML suspension  Commonly known as:  MAALOX/MYLANTA  Take 30 mLs by mouth every 6 (six) hours as needed for indigestion or heartburn (dyspepsia).     bisacodyl 5 MG EC tablet  Commonly known as:  DULCOLAX  Take 1 tablet (5 mg total) by mouth daily as needed for moderate constipation.     carbidopa-levodopa 25-250 MG per tablet  Commonly known as:  SINEMET IR  Take 1 tablet by mouth 4 (four) times daily - after meals and at bedtime. 1 @@ 0900, 1300, 1700, 2100      carboxymethylcellulose 0.5 % Soln  Commonly known as:  REFRESH PLUS  Place 2 drops into both eyes 3 (three) times daily.     diazepam 5 MG/ML injection  Commonly known as:  VALIUM  Inject 0.5 mLs (2.5 mg total) into the vein every 4 (four) hours as needed.     diazepam 2 MG tablet  Commonly known as:  VALIUM  Take 1 tablet (2 mg total) by mouth every 6 (six) hours as needed for anxiety, muscle spasms or sedation (sleep).     famotidine 20-0.9 MG/50ML-%  Commonly  known as:  PEPCID  Inject 50 mLs (20 mg total) into the vein daily.     HYDROmorphone 1 MG/ML injection  Commonly known as:  DILAUDID  Inject 0.25 mLs (0.25 mg total) into the vein every 2 (two) hours as needed for moderate pain or severe pain.     HYDROmorphone 2 MG tablet  Commonly known as:  DILAUDID  Take 1-2 tablets (2-4 mg total) by mouth every 2 (two) hours as needed for moderate pain or severe pain (give oral first as PRN).     ondansetron 4 MG/2ML Soln injection  Commonly known as:  ZOFRAN  Inject 2 mLs (4 mg total) into the vein every 8 (eight) hours.     polyethylene glycol packet  Commonly known as:  MIRALAX / GLYCOLAX  Take 17 g by mouth daily as needed for mild constipation.     pramipexole 0.125 MG tablet  Commonly known as:  MIRAPEX  Take 1 tablet (0.125 mg total) by mouth 3 (three) times daily.     promethazine 25 MG/ML injection  Commonly known as:  PHENERGAN  Inject 0.25 mLs (6.25 mg total) into the vein every 4 (four) hours as needed.     sertraline 50 MG tablet  Commonly known as:  ZOLOFT  Take 1 tablet (50 mg total) by mouth daily.       Follow-up Information    Follow up with [facility], MD.   Specialty:  Internal Medicine   Contact information:   [address] [place] [address] [phone]        The results of significant diagnostics from this hospitalization (including imaging, microbiology, ancillary and laboratory) are  listed below for reference.    Significant Diagnostic Studies: Dg Chest Port 1 View  10/09/2014   CLINICAL DATA:  with history of Parkinson's disease, wheelchair dependent with some memory problems who was referred from Dr. Paulita Fujita today after EGD. The patient was at their baseline health about 3 months ago when she developed pneumonia. She never regained her strength. Over the two weeks prior to presentation, she developed progressive fatigue, jaundice, abdominal pain, and nausea. She also developed bruising and was seen at [facility] ER with an INR of 10. She was given vitamin K and her INR normalized. Her liver function tests demonstrated obstructive jaundice. underwent ERCP on 3/9. 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 ERCP was aborted and she has been referred for percutaneous biliary drain by IR. She has had intermittent epigastric and RUQ pains not worse with eating although she has had some early satiety. Underwent percutaneous biliary drain placement on 3/10 and post-procedure she has been persistently hypotensive to the 94W and 54O systolic  EXAM: PORTABLE CHEST - 1 VIEW  COMPARISON:  06/14/2007  FINDINGS: Low lung volumes. Linear scarring or atelectasis in the mid lung zones as before. Mild central interstitial prominence bilaterally may be secondary to atelectasis although I can't entirely exclude mild interstitial edema or infiltrate. Heart size upper limits normal for technique. No effusion. Thoracolumbar spinal fixation hardware is partially seen. Percutaneous drain projects over the right upper abdomen. Degenerative changes in bilateral shoulders.  IMPRESSION: 1. Bilateral interstitial prominence may be secondary to low lung volumes versus early interstitial edema or infiltrate. 2. Postop and degenerative changes as above.   Electronically Signed   By: Lucrezia Europe M.D.   On: 10/09/2014 17:17  Ir Biliary  Drain Placement With Cholangiogram  10/09/2014   INDICATION: High-grade biliary obstruction with endoscopic ultrasound demonstrating ill-defined necrotic mass in the region of the porta hepatis without normal-appearing extrahepatic bile ducts. The patient's bilirubin is 24.  EXAM: 1. PERCUTANEOUS TRANSHEPATIC CHOLANGIOGRAM UNDER FLUOROSCOPY WITH ULTRASOUND GUIDANCE FOR RIGHT LOBE BILE DUCT ACCESS 2. PLACEMENT OF NEW EXTERNAL PERCUTANEOUS BILIARY DRAINAGE CATHETER  COMPARISON:  None.  MEDICATIONS: A scheduled dose of 3.375 g IV Zosyn was infusing during the procedure.  CONTRAST:  30 mL Omnipaque 300  ANESTHESIA/SEDATION: 0.5 mg IV Versed, 25 mcg IV fentanyl.  Total Moderate Sedation Time  24 minutes  FLUOROSCOPY TIME:  7 minutes and 54 seconds.  COMPLICATIONS: None immediate  TECHNIQUE: Informed written consent was obtained from the patient's daughter after a discussion of the risks, benefits and alternatives to treatment. Questions regarding the procedure were encouraged and answered. A timeout was performed prior to the initiation of the procedure.  The right upper abdominal quadrant was prepped and draped in the usual sterile fashion, and a sterile drape was applied covering the operative field. Maximum barrier sterile technique with sterile gowns and gloves were used for the procedure. A timeout was performed prior to the initiation of the procedure.  Ultrasound scanning of the right upper abdominal quadrant was performed to delineate the anatomy and avoid transgression of the gallbladder or the pleural space.  After the overlying soft tissues were anesthetized with 1% Lidocaine, a 21 gauge needle was advanced into a right lobe bile duct under direct ultrasound guidance. Ultrasound image documentation was performed. Contrast was injected through the needle after return of bile. Cholangiogram spot images were obtained under fluoroscopy.  A transitional dilator was further advanced into central hepatic ducts and  contrast injection performed. A 5 French catheter was then advanced into the common bile duct over a guidewire and additional cholangiogram thick images obtained with contrast injection.  Various guidewires were utilized in attempt to traverse a common bile duct obstruction. Ultimately, a 42 [place] external drainage catheter was advanced over a guidewire and formed. Bile return was dark brown and turbid in nature. Bile samples were sent for culture analysis and cytology. This was injected with contrast material to confirm position. The catheter was then flushed and connected to a gravity drainage bag. The catheter was secured at the skin with a Prolene retention suture and StatLock device.  FINDINGS: Cholangiography was performed after needle access of a peripheral right lobe hepatic duct. Contrast injection demonstrates severe and diffuse intrahepatic biliary ductal dilatation. Both right-sided and left-sided hepatic ducts are massively dilated.  More central injection shows a dilated proper hepatic and common hepatic bile duct with significant focal dilatation noted at roughly the level of the proximal to mid common bile duct. The cystic duct is not visualized. High-grade obstruction was present with inability to pass a guidewire through the level of obstruction into the duodenum.  IMPRESSION: Massive dilatation of intrahepatic ducts and proximal extrahepatic bile ducts with significant dilatation of the proximal to mid common bile duct with complete obstruction present. The obstruction could not be crossed with a guidewire. A 10 [place] external biliary drainage catheter was placed and is draining bile. Bile samples were sent for culture and cytologic analysis.   Electronically Signed   By: Aletta Edouard M.D.   On: 10/09/2014 15:44    Microbiology: Recent Results (from the past 240 hour(s))  Culture, routine-abscess     Status: None (Preliminary result)   Collection Time: 10/09/14 12:50  PM  Result Value  Ref Range Status   Specimen Description ABSCESS COMMON BIE DUCT  Final   Special Requests Normal  Final   Gram Stain   Final    NO WBC SEEN NO SQUAMOUS EPITHELIAL CELLS SEEN NO ORGANISMS SEEN Performed at [facility]    Culture   Final    NO GROWTH 2 DAYS Performed at [facility]    Report Status PENDING  Incomplete  Anaerobic culture     Status: None (Preliminary result)   Collection Time: 10/09/14 12:50 PM  Result Value Ref Range Status   Specimen Description ABSCESS COMMON BILE DUCT  Final   Special Requests Normal  Final   Gram Stain   Final    NO WBC SEEN NO SQUAMOUS EPITHELIAL CELLS SEEN NO ORGANISMS SEEN Performed at [facility]    Culture   Final    NO ANAEROBES ISOLATED; CULTURE IN PROGRESS FOR 5 DAYS Performed at [facility]    Report Status PENDING  Incomplete     Labs: Basic Metabolic Panel:  Recent Labs Lab 10/08/14 1532 10/09/14 0553 10/09/14 1620  NA 132* 131* 133*  K 3.3* 2.9* 3.1*  CL 97 98 103  CO2 _0 GLUCOSE 109* 106* 132*  BUN _1 CREATININE 0.41* 0.36* 0.38*  CALCIUM 8.6 8.4 7.9*  MG 2.3  --   --    Liver Function Tests:  Recent Labs Lab 10/08/14 1532 10/09/14 0553 10/09/14 1620  AST 140* 131* 115*  ALT _2 ALKPHOS 382* 337* 334*  BILITOT 25.6* 23.6* 22.0*  PROT 5.8* 5.3* 5.1*  ALBUMIN 2.2* 2.0* 1.8*   No results for input(s): LIPASE, AMYLASE in the last 168 hours. No results for input(s): AMMONIA in the last 168 hours. CBC:  Recent Labs Lab 10/08/14 1532 10/09/14 0553 10/09/14 1430 10/09/14 1620  WBC 6.5 4.9  --   --   HGB 12.6 11.1* 11.4* 11.2*  HCT 36.8 32.5* 32.9* 32.7*  MCV 92.0 91.8  --   --   PLT 153 126*  --   --    Cardiac Enzymes:  Recent Labs Lab 10/09/14 1620  TROPONINI 0.03   BNP: BNP (last 3 results) No results for input(s): BNP in the last 8760 hours.  ProBNP (last 3 results) No results for input(s): PROBNP in the last 8760  hours.  CBG: No results for input(s): GLUCAP in the last 168 hours.  Time coordinating discharge: 45 minutes  Signed:  Amilcar Reever  [facility] 10/12/2014, 10:22 AM

## 2014-10-13 LAB — CULTURE, ROUTINE-ABSCESS
Culture: NO GROWTH
Gram Stain: NONE SEEN
Special Requests: NORMAL

## 2014-10-14 LAB — ANAEROBIC CULTURE
GRAM STAIN: NONE SEEN
Special Requests: NORMAL

## 2014-11-30 DEATH — deceased

## 2016-07-24 IMAGING — XA IR BILIARY DRAIN PLACEMENT W/ CHOLANGIOGRAM
1 series · 9 of 9 positions shown · non-contrast
Comparison: None.

INDICATION: High-grade biliary obstruction with endoscopic ultrasound
demonstrating ill-defined necrotic mass in the region of the porta
hepatis without normal-appearing extrahepatic bile ducts. The
patient's bilirubin is 24.

EXAM:
1. PERCUTANEOUS TRANSHEPATIC CHOLANGIOGRAM UNDER FLUOROSCOPY WITH
ULTRASOUND GUIDANCE FOR RIGHT LOBE BILE DUCT ACCESS
2. PLACEMENT OF NEW EXTERNAL PERCUTANEOUS BILIARY DRAINAGE CATHETER
TECHNIQUE: Informed written consent was obtained from the patient's daughter
after a discussion of the risks, benefits and alternatives to
treatment. Questions regarding the procedure were encouraged and
answered. A timeout was performed prior to the initiation of the
procedure.

[Series 300: ir percutaneous transhepatic cholangiogr · 9 of 9 slices shown]
[im 1/9]
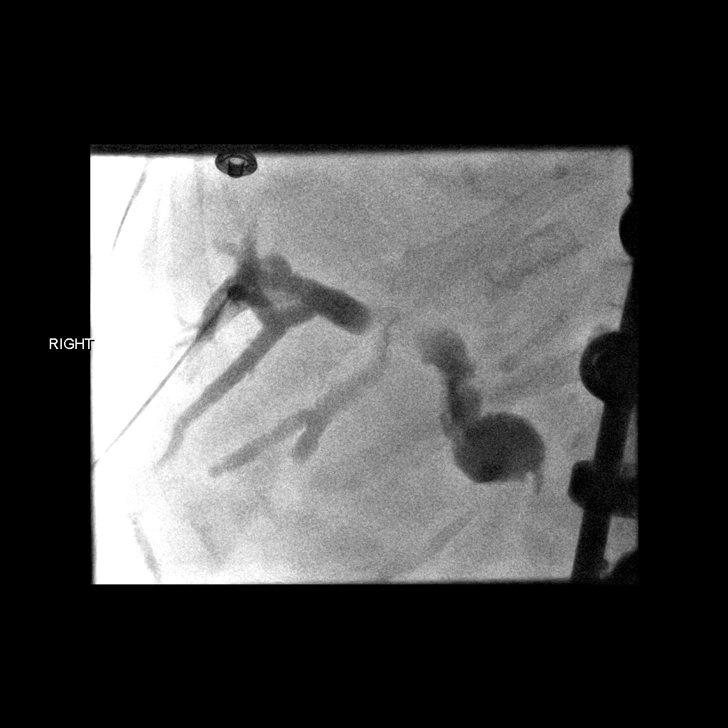
[im 2/9]
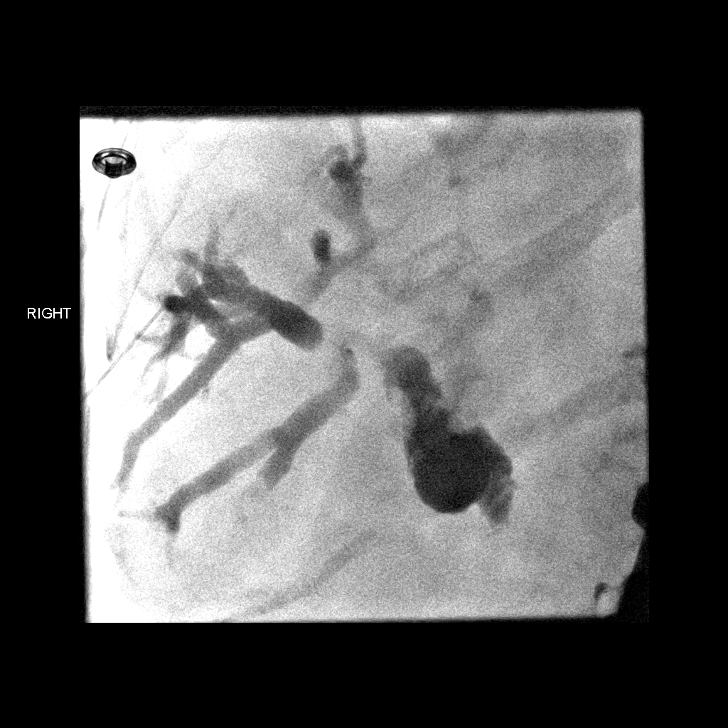
[im 3/9]
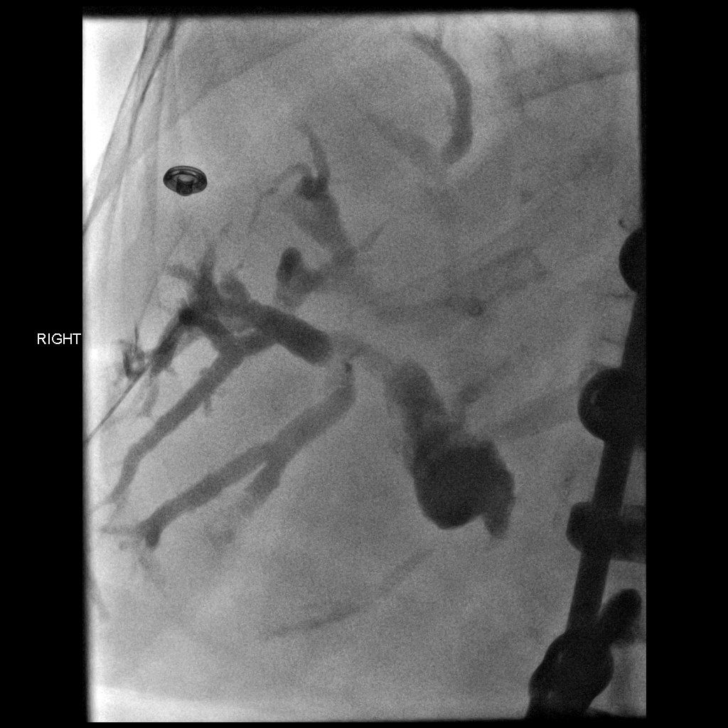
[im 4/9]
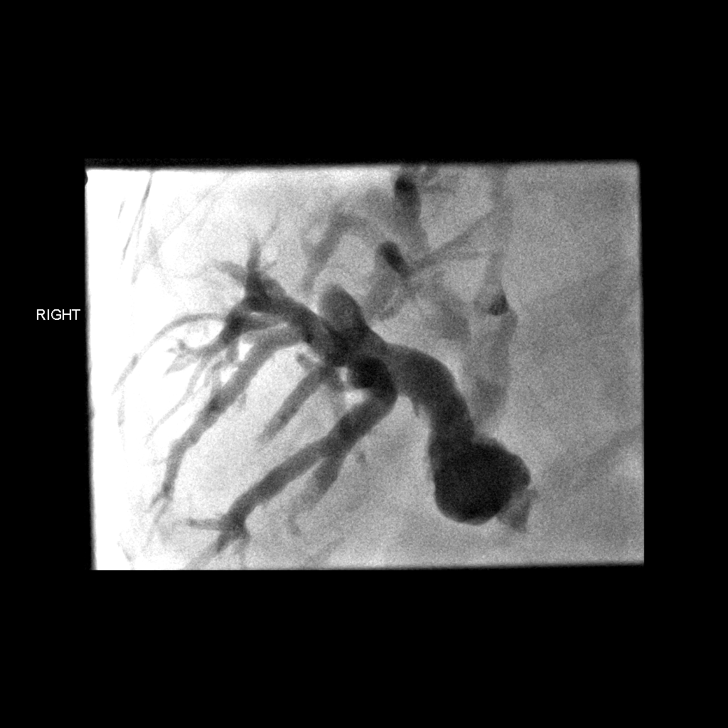
[im 5/9]
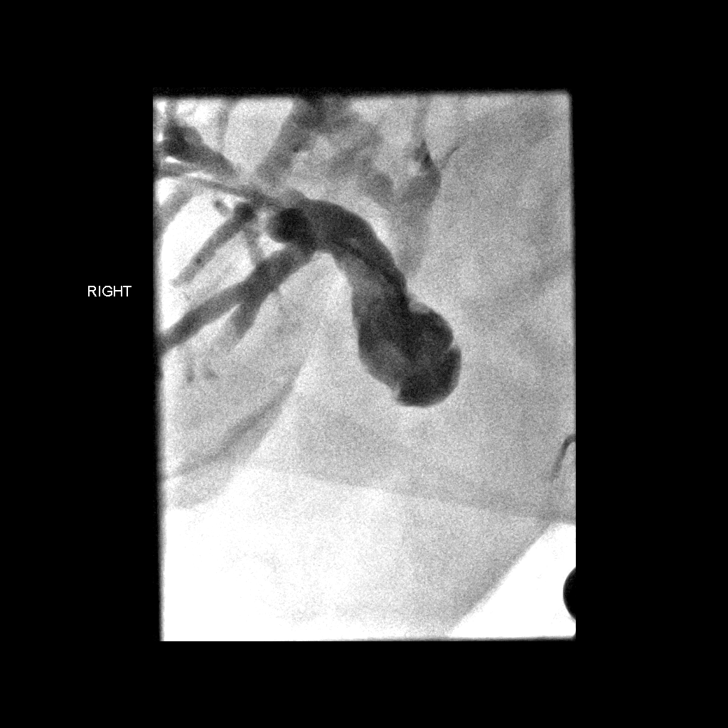
[im 6/9]
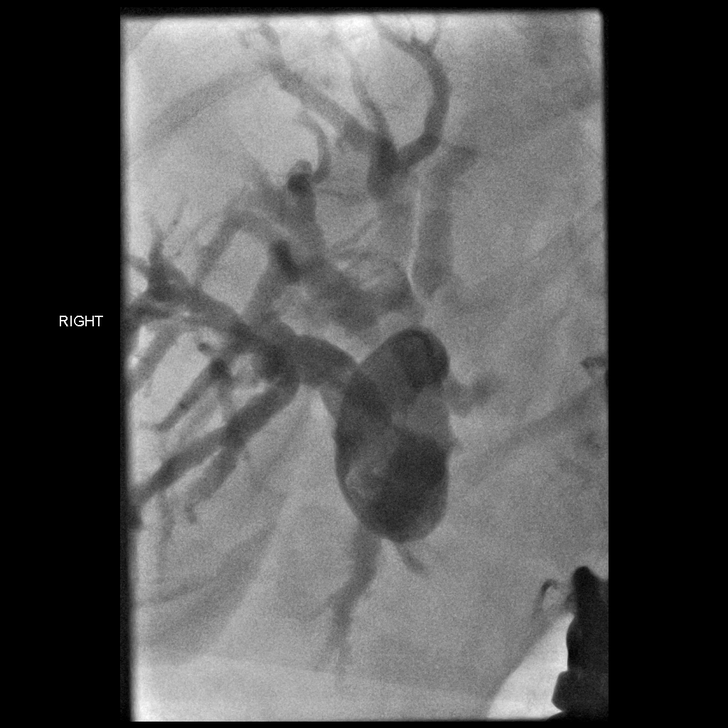
[im 7/9]
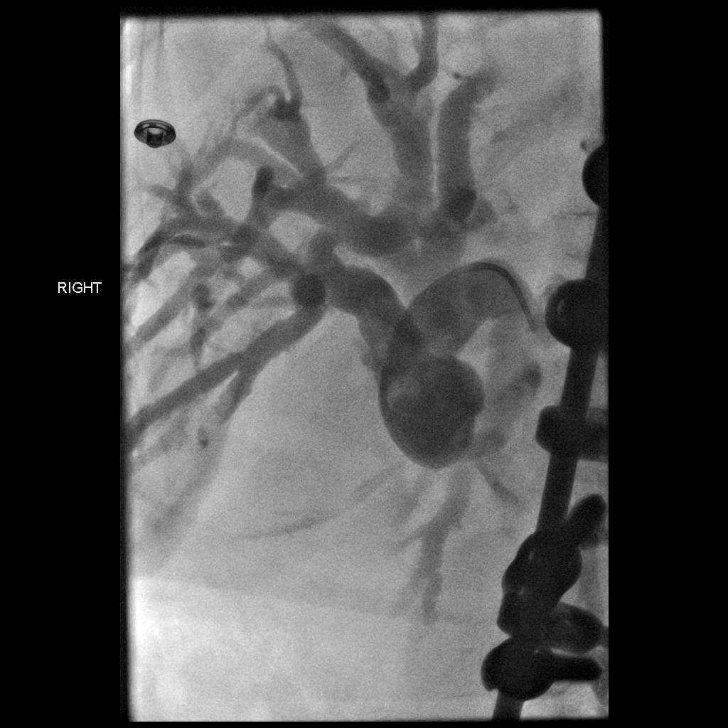
[im 8/9]
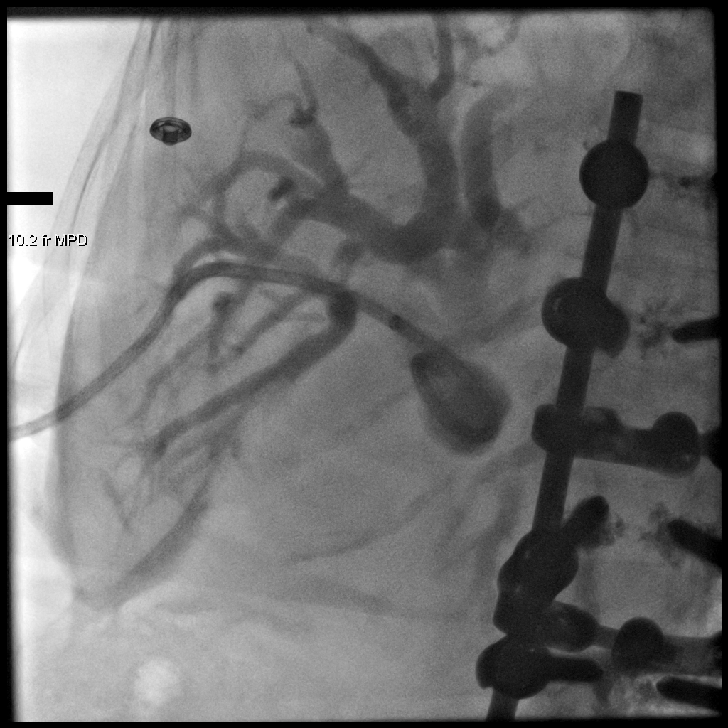
[im 9/9]
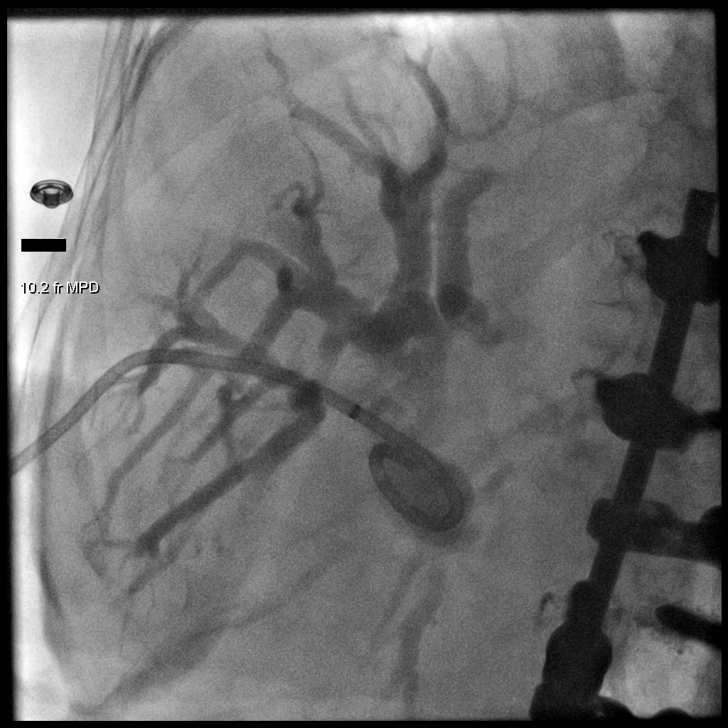

[9 of 9 positions shown; findings below may reference images not displayed]

MEDICATIONS:
A scheduled dose of 3.375 g IV Zosyn was infusing during the
procedure.

CONTRAST:  30 mL Omnipaque 300

ANESTHESIA/SEDATION:
0.5 mg IV Versed, 25 mcg IV fentanyl.

Total Moderate Sedation Time

24 minutes

FLUOROSCOPY TIME:  7 minutes and 54 seconds.

COMPLICATIONS:
None immediate
The right upper abdominal quadrant was prepped and draped in the
usual sterile fashion, and a sterile drape was applied covering the
operative field. Maximum barrier sterile technique with sterile
gowns and gloves were used for the procedure. A timeout was
performed prior to the initiation of the procedure.

Ultrasound scanning of the right upper abdominal quadrant was
performed to delineate the anatomy and avoid transgression of the
gallbladder or the pleural space.

After the overlying soft tissues were anesthetized with 1%
Lidocaine, a 21 gauge needle was advanced into a right lobe bile
duct under direct ultrasound guidance. Ultrasound image
documentation was performed. Contrast was injected through the
needle after return of bile. Cholangiogram spot images were obtained
under fluoroscopy.

A transitional dilator was further advanced into central hepatic
ducts and contrast injection performed. A 5 French catheter was then
advanced into the common bile duct over a guidewire and additional
cholangiogram thick images obtained with contrast injection.

Various guidewires were utilized in attempt to traverse a common
bile duct obstruction. Ultimately, a 10 French external drainage
catheter was advanced over a guidewire and formed. Bile return was
dark brown and turbid in nature. Bile samples were sent for culture
analysis and cytology. This was injected with contrast material to
confirm position. The catheter was then flushed and connected to a
gravity drainage bag. The catheter was secured at the skin with a
Prolene retention suture and StatLock device.
FINDINGS: Cholangiography was performed after needle access of a peripheral
right lobe hepatic duct. Contrast injection demonstrates severe and
diffuse intrahepatic biliary ductal dilatation. Both right-sided and
left-sided hepatic ducts are massively dilated.

More central injection shows a dilated proper hepatic and common
hepatic bile duct with significant focal dilatation noted at roughly
the level of the proximal to mid common bile duct. The cystic duct
is not visualized. High-grade obstruction was present with inability
to pass a guidewire through the level of obstruction into the
duodenum.
IMPRESSION: Massive dilatation of intrahepatic ducts and proximal extrahepatic
bile ducts with significant dilatation of the proximal to mid common
bile duct with complete obstruction present. The obstruction could
not be crossed with a guidewire. A 10 French external biliary
drainage catheter was placed and is draining bile. Bile samples were
sent for culture and cytologic analysis.
